# Patient Record
Sex: Female | Born: 1960 | Race: White | Hispanic: No | Marital: Married | Smoking: Never smoker
Health system: Southern US, Community
[De-identification: ages and names within clinical notes are randomized; demographics above are authoritative.]

## PROBLEM LIST (undated history)

## (undated) DIAGNOSIS — Z0000 Encounter for general adult medical examination without abnormal findings: Secondary | ICD-10-CM

## (undated) DIAGNOSIS — B019 Varicella without complication: Secondary | ICD-10-CM

## (undated) DIAGNOSIS — E039 Hypothyroidism, unspecified: Secondary | ICD-10-CM

## (undated) DIAGNOSIS — N950 Postmenopausal bleeding: Secondary | ICD-10-CM

## (undated) DIAGNOSIS — T7840XA Allergy, unspecified, initial encounter: Secondary | ICD-10-CM

## (undated) DIAGNOSIS — E669 Obesity, unspecified: Secondary | ICD-10-CM

## (undated) DIAGNOSIS — J309 Allergic rhinitis, unspecified: Secondary | ICD-10-CM

## (undated) DIAGNOSIS — R739 Hyperglycemia, unspecified: Secondary | ICD-10-CM

## (undated) DIAGNOSIS — Z124 Encounter for screening for malignant neoplasm of cervix: Secondary | ICD-10-CM

## (undated) DIAGNOSIS — I10 Essential (primary) hypertension: Secondary | ICD-10-CM

## (undated) DIAGNOSIS — N951 Menopausal and female climacteric states: Secondary | ICD-10-CM

## (undated) DIAGNOSIS — Z86010 Personal history of colonic polyps: Secondary | ICD-10-CM

## (undated) DIAGNOSIS — E782 Mixed hyperlipidemia: Secondary | ICD-10-CM

## (undated) DIAGNOSIS — M533 Sacrococcygeal disorders, not elsewhere classified: Secondary | ICD-10-CM

## (undated) DIAGNOSIS — K5790 Diverticulosis of intestine, part unspecified, without perforation or abscess without bleeding: Secondary | ICD-10-CM

## (undated) HISTORY — DX: Allergy, unspecified, initial encounter: T78.40XA

## (undated) HISTORY — DX: Mixed hyperlipidemia: E78.2

## (undated) HISTORY — DX: Varicella without complication: B01.9

## (undated) HISTORY — DX: Obesity, unspecified: E66.9

## (undated) HISTORY — PX: COLONOSCOPY: SHX174

## (undated) HISTORY — DX: Sacrococcygeal disorders, not elsewhere classified: M53.3

## (undated) HISTORY — DX: Hyperglycemia, unspecified: R73.9

## (undated) HISTORY — PX: POLYPECTOMY: SHX149

## (undated) HISTORY — DX: Encounter for screening for malignant neoplasm of cervix: Z12.4

## (undated) HISTORY — DX: Encounter for general adult medical examination without abnormal findings: Z00.00

## (undated) HISTORY — DX: Diverticulosis of intestine, part unspecified, without perforation or abscess without bleeding: K57.90

## (undated) HISTORY — PX: KNEE SURGERY: SHX244

## (undated) HISTORY — DX: Hypothyroidism, unspecified: E03.9

## (undated) HISTORY — DX: Postmenopausal bleeding: N95.0

## (undated) HISTORY — DX: Menopausal and female climacteric states: N95.1

## (undated) HISTORY — DX: Essential (primary) hypertension: I10

## (undated) HISTORY — DX: Allergic rhinitis, unspecified: J30.9

## (undated) HISTORY — DX: Personal history of colonic polyps: Z86.010

---

## 1993-07-17 HISTORY — PX: TUBAL LIGATION: SHX77

## 1998-02-12 ENCOUNTER — Ambulatory Visit (HOSPITAL_COMMUNITY): Admission: RE | Admit: 1998-02-12 | Discharge: 1998-02-12 | Payer: Self-pay | Admitting: Obstetrics and Gynecology

## 2000-07-20 ENCOUNTER — Encounter: Payer: Self-pay | Admitting: Obstetrics and Gynecology

## 2000-07-20 ENCOUNTER — Ambulatory Visit (HOSPITAL_COMMUNITY): Admission: RE | Admit: 2000-07-20 | Discharge: 2000-07-20 | Payer: Self-pay | Admitting: Obstetrics and Gynecology

## 2000-08-24 ENCOUNTER — Ambulatory Visit (HOSPITAL_COMMUNITY): Admission: RE | Admit: 2000-08-24 | Discharge: 2000-08-24 | Payer: Self-pay | Admitting: Obstetrics and Gynecology

## 2001-08-13 ENCOUNTER — Other Ambulatory Visit: Admission: RE | Admit: 2001-08-13 | Discharge: 2001-08-13 | Payer: Self-pay | Admitting: Obstetrics and Gynecology

## 2003-04-10 ENCOUNTER — Other Ambulatory Visit: Admission: RE | Admit: 2003-04-10 | Discharge: 2003-04-10 | Payer: Self-pay | Admitting: Obstetrics and Gynecology

## 2003-04-22 ENCOUNTER — Encounter: Payer: Self-pay | Admitting: Obstetrics and Gynecology

## 2003-04-22 ENCOUNTER — Encounter: Admission: RE | Admit: 2003-04-22 | Discharge: 2003-04-22 | Payer: Self-pay | Admitting: Obstetrics and Gynecology

## 2004-04-18 ENCOUNTER — Other Ambulatory Visit: Admission: RE | Admit: 2004-04-18 | Discharge: 2004-04-18 | Payer: Self-pay | Admitting: Obstetrics and Gynecology

## 2004-12-26 ENCOUNTER — Ambulatory Visit: Payer: Self-pay | Admitting: Internal Medicine

## 2005-01-02 ENCOUNTER — Ambulatory Visit: Payer: Self-pay | Admitting: Internal Medicine

## 2005-01-12 ENCOUNTER — Ambulatory Visit: Payer: Self-pay | Admitting: Internal Medicine

## 2006-01-29 ENCOUNTER — Ambulatory Visit: Payer: Self-pay | Admitting: Family Medicine

## 2006-03-06 ENCOUNTER — Ambulatory Visit: Payer: Self-pay | Admitting: Internal Medicine

## 2006-03-20 ENCOUNTER — Other Ambulatory Visit: Admission: RE | Admit: 2006-03-20 | Discharge: 2006-03-20 | Payer: Self-pay | Admitting: Internal Medicine

## 2006-03-20 ENCOUNTER — Ambulatory Visit: Payer: Self-pay | Admitting: Internal Medicine

## 2006-03-27 ENCOUNTER — Encounter: Admission: RE | Admit: 2006-03-27 | Discharge: 2006-03-27 | Payer: Self-pay | Admitting: Internal Medicine

## 2006-04-03 ENCOUNTER — Encounter: Admission: RE | Admit: 2006-04-03 | Discharge: 2006-04-03 | Payer: Self-pay | Admitting: Internal Medicine

## 2006-05-22 ENCOUNTER — Ambulatory Visit: Payer: Self-pay | Admitting: Internal Medicine

## 2006-06-27 ENCOUNTER — Ambulatory Visit: Payer: Self-pay | Admitting: Internal Medicine

## 2006-10-02 ENCOUNTER — Ambulatory Visit: Payer: Self-pay | Admitting: Internal Medicine

## 2006-10-02 LAB — CONVERTED CEMR LAB: TSH: 3.85 microintl units/mL (ref 0.35–5.50)

## 2007-03-26 ENCOUNTER — Ambulatory Visit: Payer: Self-pay | Admitting: Internal Medicine

## 2007-03-26 LAB — CONVERTED CEMR LAB
AST: 20 units/L (ref 0–37)
Albumin: 3.8 g/dL (ref 3.5–5.2)
Alkaline Phosphatase: 58 units/L (ref 39–117)
Basophils Absolute: 0 10*3/uL (ref 0.0–0.1)
Basophils Relative: 0.5 % (ref 0.0–1.0)
Bilirubin, Direct: 0.1 mg/dL (ref 0.0–0.3)
CO2: 30 meq/L (ref 19–32)
Creatinine, Ser: 0.8 mg/dL (ref 0.4–1.2)
Eosinophils Relative: 1.7 % (ref 0.0–5.0)
GFR calc Af Amer: 100 mL/min
Glucose, Bld: 85 mg/dL (ref 70–99)
HDL: 85.4 mg/dL (ref >39.0)
Ketones, urine, test strip: NEGATIVE
Lymphocytes Relative: 24.1 % (ref 12.0–46.0)
Monocytes Absolute: 0.4 10*3/uL (ref 0.2–0.7)
Monocytes Relative: 6.7 % (ref 3.0–11.0)
Potassium: 4.8 meq/L (ref 3.5–5.1)
Protein, U semiquant: NEGATIVE
RBC: 4.12 M/uL (ref 3.87–5.11)
RDW: 13.2 % (ref 11.5–14.6)
Specific Gravity, Urine: >=1.03
TSH: 5.49 microintl units/mL (ref 0.35–5.50)
Total Bilirubin: 0.7 mg/dL (ref 0.3–1.2)
Total CHOL/HDL Ratio: 2.4
Triglycerides: 148 mg/dL (ref 0–149)
Urobilinogen, UA: 0.2
WBC Urine, dipstick: NEGATIVE
WBC: 5.8 10*3/uL (ref 4.5–10.5)
pH: 5.5

## 2007-03-28 DIAGNOSIS — Z9109 Other allergy status, other than to drugs and biological substances: Secondary | ICD-10-CM

## 2007-03-28 DIAGNOSIS — E039 Hypothyroidism, unspecified: Secondary | ICD-10-CM

## 2007-03-28 DIAGNOSIS — J309 Allergic rhinitis, unspecified: Secondary | ICD-10-CM

## 2007-03-28 DIAGNOSIS — M545 Low back pain: Secondary | ICD-10-CM

## 2007-03-28 HISTORY — DX: Allergic rhinitis, unspecified: J30.9

## 2007-03-28 HISTORY — DX: Hypothyroidism, unspecified: E03.9

## 2007-04-02 ENCOUNTER — Encounter: Payer: Self-pay | Admitting: Internal Medicine

## 2007-04-02 ENCOUNTER — Ambulatory Visit: Payer: Self-pay | Admitting: Internal Medicine

## 2007-04-02 ENCOUNTER — Other Ambulatory Visit: Admission: RE | Admit: 2007-04-02 | Discharge: 2007-04-02 | Payer: Self-pay | Admitting: Internal Medicine

## 2007-04-09 ENCOUNTER — Encounter: Admission: RE | Admit: 2007-04-09 | Discharge: 2007-04-09 | Payer: Self-pay | Admitting: Internal Medicine

## 2007-04-10 ENCOUNTER — Telehealth: Payer: Self-pay | Admitting: *Deleted

## 2007-05-27 ENCOUNTER — Ambulatory Visit: Payer: Self-pay | Admitting: Internal Medicine

## 2007-06-03 ENCOUNTER — Ambulatory Visit: Payer: Self-pay | Admitting: Internal Medicine

## 2007-09-03 ENCOUNTER — Ambulatory Visit: Payer: Self-pay | Admitting: Internal Medicine

## 2007-09-03 LAB — CONVERTED CEMR LAB: TSH: 2.77 microintl units/mL (ref 0.35–5.50)

## 2007-09-19 ENCOUNTER — Ambulatory Visit: Payer: Self-pay | Admitting: Internal Medicine

## 2007-09-19 DIAGNOSIS — T22039A Burn of unspecified degree of unspecified upper arm, initial encounter: Secondary | ICD-10-CM | POA: Insufficient documentation

## 2007-11-22 ENCOUNTER — Ambulatory Visit: Payer: Self-pay | Admitting: Internal Medicine

## 2007-11-29 ENCOUNTER — Ambulatory Visit: Payer: Self-pay | Admitting: Internal Medicine

## 2008-03-30 ENCOUNTER — Ambulatory Visit: Payer: Self-pay | Admitting: Internal Medicine

## 2008-03-30 LAB — CONVERTED CEMR LAB
ALT: 18 units/L (ref 0–35)
Albumin: 4 g/dL (ref 3.5–5.2)
Alkaline Phosphatase: 53 units/L (ref 39–117)
BUN: 17 mg/dL (ref 6–23)
Basophils Absolute: 0 10*3/uL (ref 0.0–0.1)
Bilirubin Urine: NEGATIVE
CO2: 30 meq/L (ref 19–32)
Cholesterol: 161 mg/dL (ref 0–200)
Creatinine, Ser: 0.9 mg/dL (ref 0.4–1.2)
Eosinophils Relative: 2.1 % (ref 0.0–5.0)
GFR calc non Af Amer: 72 mL/min
Glucose, Urine, Semiquant: NEGATIVE
HDL: 58.9 mg/dL (ref >39.0)
Ketones, urine, test strip: NEGATIVE
LDL Cholesterol: 66 mg/dL (ref 0–99)
Lymphocytes Relative: 28 % (ref 12.0–46.0)
MCHC: 34.8 g/dL (ref 30.0–36.0)
MCV: 93.2 fL (ref 78.0–100.0)
Platelets: 191 10*3/uL (ref 150–400)
RBC: 4.12 M/uL (ref 3.87–5.11)
RDW: 12.4 % (ref 11.5–14.6)
Sodium: 141 meq/L (ref 135–145)
TSH: 3.07 microintl units/mL (ref 0.35–5.50)
Total Protein: 7.2 g/dL (ref 6.0–8.3)
Triglycerides: 182 mg/dL — ABNORMAL HIGH (ref 0–149)

## 2008-04-03 ENCOUNTER — Encounter: Payer: Self-pay | Admitting: Internal Medicine

## 2008-04-03 ENCOUNTER — Ambulatory Visit: Payer: Self-pay | Admitting: Internal Medicine

## 2008-04-03 ENCOUNTER — Other Ambulatory Visit: Admission: RE | Admit: 2008-04-03 | Discharge: 2008-04-03 | Payer: Self-pay | Admitting: Internal Medicine

## 2008-04-03 DIAGNOSIS — N951 Menopausal and female climacteric states: Secondary | ICD-10-CM

## 2008-04-03 HISTORY — DX: Menopausal and female climacteric states: N95.1

## 2008-04-22 ENCOUNTER — Encounter: Admission: RE | Admit: 2008-04-22 | Discharge: 2008-04-22 | Payer: Self-pay | Admitting: Internal Medicine

## 2008-09-28 ENCOUNTER — Ambulatory Visit: Payer: Self-pay | Admitting: Internal Medicine

## 2008-10-05 ENCOUNTER — Ambulatory Visit: Payer: Self-pay | Admitting: Internal Medicine

## 2008-10-05 DIAGNOSIS — I10 Essential (primary) hypertension: Secondary | ICD-10-CM

## 2008-10-05 HISTORY — DX: Essential (primary) hypertension: I10

## 2009-01-12 ENCOUNTER — Telehealth: Payer: Self-pay | Admitting: Internal Medicine

## 2009-03-29 ENCOUNTER — Ambulatory Visit: Payer: Self-pay | Admitting: Internal Medicine

## 2009-03-29 LAB — CONVERTED CEMR LAB
Bilirubin Urine: NEGATIVE
CO2: 30 meq/L (ref 19–32)
Calcium: 8.9 mg/dL (ref 8.4–10.5)
Chloride: 107 meq/L (ref 96–112)
Eosinophils Absolute: 0.1 10*3/uL (ref 0.0–0.7)
GFR calc non Af Amer: 81.86 mL/min (ref >60)
HCT: 40.7 % (ref 36.0–46.0)
Hemoglobin: 13.8 g/dL (ref 12.0–15.0)
Ketones, urine, test strip: NEGATIVE
Neutro Abs: 4.9 10*3/uL (ref 1.4–7.7)
Nitrite: NEGATIVE
Potassium: 4.7 meq/L (ref 3.5–5.1)
RDW: 13 % (ref 11.5–14.6)
Sodium: 142 meq/L (ref 135–145)
Specific Gravity, Urine: 1.025
TSH: 2.19 microintl units/mL (ref 0.35–5.50)
Total Bilirubin: 1 mg/dL (ref 0.3–1.2)
Total CHOL/HDL Ratio: 2
Urobilinogen, UA: 0.2
VLDL: 16 mg/dL (ref 0.0–40.0)
WBC Urine, dipstick: NEGATIVE
pH: 5

## 2009-04-05 ENCOUNTER — Other Ambulatory Visit: Admission: RE | Admit: 2009-04-05 | Discharge: 2009-04-05 | Payer: Self-pay | Admitting: Internal Medicine

## 2009-04-05 ENCOUNTER — Ambulatory Visit: Payer: Self-pay | Admitting: Internal Medicine

## 2009-04-05 ENCOUNTER — Encounter: Payer: Self-pay | Admitting: Internal Medicine

## 2009-04-23 ENCOUNTER — Encounter: Admission: RE | Admit: 2009-04-23 | Discharge: 2009-04-23 | Payer: Self-pay | Admitting: Internal Medicine

## 2009-05-03 ENCOUNTER — Telehealth: Payer: Self-pay | Admitting: Internal Medicine

## 2009-05-03 DIAGNOSIS — N950 Postmenopausal bleeding: Secondary | ICD-10-CM | POA: Insufficient documentation

## 2009-05-03 HISTORY — DX: Postmenopausal bleeding: N95.0

## 2009-05-05 ENCOUNTER — Encounter: Admission: RE | Admit: 2009-05-05 | Discharge: 2009-05-05 | Payer: Self-pay | Admitting: Internal Medicine

## 2009-10-04 ENCOUNTER — Ambulatory Visit: Payer: Self-pay | Admitting: Internal Medicine

## 2010-02-01 ENCOUNTER — Ambulatory Visit: Payer: Self-pay | Admitting: Internal Medicine

## 2010-04-04 ENCOUNTER — Telehealth: Payer: Self-pay | Admitting: Internal Medicine

## 2010-04-26 ENCOUNTER — Encounter: Admission: RE | Admit: 2010-04-26 | Discharge: 2010-04-26 | Payer: Self-pay | Admitting: Internal Medicine

## 2010-05-18 ENCOUNTER — Ambulatory Visit: Payer: Self-pay | Admitting: Internal Medicine

## 2010-05-18 DIAGNOSIS — E669 Obesity, unspecified: Secondary | ICD-10-CM

## 2010-05-18 HISTORY — DX: Obesity, unspecified: E66.9

## 2010-06-28 ENCOUNTER — Telehealth: Payer: Self-pay | Admitting: Internal Medicine

## 2010-07-29 ENCOUNTER — Encounter: Payer: Self-pay | Admitting: *Deleted

## 2010-08-07 ENCOUNTER — Encounter: Payer: Self-pay | Admitting: Internal Medicine

## 2010-08-16 NOTE — Progress Notes (Signed)
Summary: REQ FOR SAMPLES  Phone Note Call from Patient   Caller: Patient     [phone] Reason for Call: Acute Illness Summary of Call: Pt had to reschedule her appt that was on 9/23 and it was rescheduled to 11/2..... Pt adv that she will need more samples of BP med that Dr Lovell Sheehan had her on: ( Benicar?)  to last her till her appt....?  Initial call taken by: Debbra Riding,  April 04, 2010 3:12 PM  Follow-up for Phone Call        samples given Follow-up by: Willy Eddy, LPN,  April 04, 2010 5:12 PM
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
## 2010-08-17 ENCOUNTER — Ambulatory Visit (INDEPENDENT_AMBULATORY_CARE_PROVIDER_SITE_OTHER): Payer: Managed Care, Other (non HMO) | Admitting: Internal Medicine

## 2010-08-17 ENCOUNTER — Ambulatory Visit: Admit: 2010-08-17 | Payer: Self-pay | Admitting: Internal Medicine

## 2010-08-17 ENCOUNTER — Encounter: Payer: Self-pay | Admitting: Internal Medicine

## 2010-08-17 VITALS — BP 124/80 | HR 76 | Temp 98.1°F | Resp 14 | Ht 67.0 in | Wt 168.0 lb

## 2010-08-17 DIAGNOSIS — I10 Essential (primary) hypertension: Secondary | ICD-10-CM

## 2010-08-17 DIAGNOSIS — R635 Abnormal weight gain: Secondary | ICD-10-CM

## 2010-08-17 DIAGNOSIS — N951 Menopausal and female climacteric states: Secondary | ICD-10-CM

## 2010-08-17 DIAGNOSIS — E785 Hyperlipidemia, unspecified: Secondary | ICD-10-CM

## 2010-08-17 DIAGNOSIS — E039 Hypothyroidism, unspecified: Secondary | ICD-10-CM

## 2010-08-17 LAB — HEPATIC FUNCTION PANEL
AST: 21 U/L (ref 0–37)
Albumin: 4.5 g/dL (ref 3.5–5.2)
Total Bilirubin: 0.5 mg/dL (ref 0.3–1.2)

## 2010-08-17 LAB — BASIC METABOLIC PANEL
CO2: 31 mEq/L (ref 19–32)
Calcium: 10.1 mg/dL (ref 8.4–10.5)
Chloride: 101 mEq/L (ref 96–112)
Creatinine, Ser: 1 mg/dL (ref 0.4–1.2)
Glucose, Bld: 83 mg/dL (ref 70–99)
Sodium: 140 mEq/L (ref 135–145)

## 2010-08-17 LAB — T4, FREE: Free T4: 1.08 ng/dL (ref 0.60–1.60)

## 2010-08-17 LAB — T3, FREE: T3, Free: 2.8 pg/mL (ref 2.3–4.2)

## 2010-08-17 LAB — TSH: TSH: 1.17 u[IU]/mL (ref 0.35–5.50)

## 2010-08-17 NOTE — Progress Notes (Signed)
  Subjective:    Patient ID: Rolena Infante, female    DOB: 05-Sep-1960, 50 y.o.   MRN: [id number]  Hypertension This is a chronic problem. The current episode started more than 1 year ago. The problem has been gradually improving since onset. The problem is controlled. Pertinent negatives include no malaise/fatigue, neck pain, orthopnea, palpitations, peripheral edema or shortness of breath. There are no associated agents to hypertension. Risk factors for coronary artery disease include no known risk factors. Past treatments include ACE inhibitors. The current treatment provides significant improvement. There are no compliance problems.  There is no history of angina, kidney disease or CAD/MI.      Review of Systems  Constitutional: Negative.  Negative for malaise/fatigue.  HENT: Negative.  Negative for neck pain.   Eyes: Negative.   Respiratory: Negative.  Negative for shortness of breath.   Cardiovascular: Negative.  Negative for palpitations and orthopnea.  Genitourinary: Negative.   Musculoskeletal: Negative.   Neurological: Negative.        Objective:   Physical Exam  Constitutional: She is oriented to person, place, and time. She appears well-developed and well-nourished.  HENT:  Head: Normocephalic and atraumatic.  Eyes: Conjunctivae are normal. Pupils are equal, round, and reactive to light.  Neck: Normal range of motion. Neck supple.  Cardiovascular: Normal rate.   Pulmonary/Chest: Effort normal and breath sounds normal.  Abdominal: Soft. Bowel sounds are normal.  Neurological: She is alert and oriented to person, place, and time.  Skin: Skin is warm and dry.          Assessment & Plan:

## 2010-08-17 NOTE — Assessment & Plan Note (Signed)
Patient has lost weight and is attempting to follow a healthier diet and exercise we reinforced the need for daily exercise as part of weight loss at her young age.

## 2010-08-17 NOTE — Progress Notes (Signed)
Addended by: Rossie Muskrat on: 08/17/2010 04:52 PM   Modules accepted: Orders

## 2010-08-17 NOTE — Assessment & Plan Note (Signed)
Patient is stable on ACE inhibitor A  bmet  will be obtained today to monitor both potassium and renal function

## 2010-08-17 NOTE — Assessment & Plan Note (Signed)
Patient is on citalopram for her PMDD and has done well

## 2010-08-17 NOTE — Patient Instructions (Signed)
We are monitoring lab work today patient is instructed to continue with weight loss plans following a healthy diet and daily exercise

## 2010-08-17 NOTE — Assessment & Plan Note (Signed)
The patient has been on 100 mcg of Synthroid and appears to be stable however she is due to a TSH T3 free and T4 free

## 2010-08-18 NOTE — Progress Notes (Signed)
Summary: REFILL REQUEST  Phone Note Refill Request Message from:  Patient on June 28, 2010 8:40 AM  Refills Requested: Medication #1:  BENICAR HCT 20-12.5 MG TABS one by mouth daily.   Notes: [facility].  Medication #2:  SYNTHROID 100 MCG  TABS one by mouth daily   Notes: [facility].     Initial call taken by: Debbra Riding,  June 28, 2010 8:40 AM    Prescriptions: BENICAR HCT 20-12.5 MG TABS (OLMESARTAN MEDOXOMIL-HCTZ) one by mouth daily  #0 x 6   Entered by:   Willy Eddy, LPN   Authorized by:   Stacie Glaze MD   Signed by:   Willy Eddy, LPN on 60/45/4098   Method used:   Electronically to        [id number]       [address]       [place], [place]  [address]       Ph: [phone] or [phone]       Fax: [phone]   RxID:   [id number] SYNTHROID 100 MCG  TABS (LEVOTHYROXINE SODIUM) one by mouth daily  #30 Tablet x 6   Entered by:   Willy Eddy, LPN   Authorized by:   Stacie Glaze MD   Signed by:   Willy Eddy, LPN on 42/59/5638   Method used:   Electronically to        [id number] (retail)       [address]       [place], [place]  [address]       Ph: [phone] or [phone]       Fax: [phone]   RxID:   [id number]

## 2010-10-11 ENCOUNTER — Telehealth: Payer: Self-pay | Admitting: Internal Medicine

## 2010-10-11 MED ORDER — ALPRAZOLAM 0.25 MG PO TABS: 0.2500 mg | ORAL_TABLET | Freq: Three times a day (TID) | ORAL | Status: AC | PRN

## 2010-10-11 NOTE — Telephone Encounter (Signed)
Pt called and said that Dr Lovell Sheehan had prescribed a anti-anxiety med for pt 5-7 yrs ago. Pt is getting ready to fly to [place] and is needing this before her trip. Pls call in to [facility]

## 2010-10-11 NOTE — Telephone Encounter (Signed)
d 

## 2010-10-11 NOTE — Telephone Encounter (Signed)
Sent in xanax

## 2010-10-19 IMAGING — US US TRANSVAGINAL NON-OB
1 series · 14 of 25 positions shown · non-contrast
Comparison: None

CLINICAL DATA: Post menopausal bleeding.

TRANSABDOMINAL AND TRANSVAGINAL ULTRASOUND OF PELVIS
TECHNIQUE: Both transabdominal and transvaginal ultrasound
examinations of the pelvis were performed including evaluation of
the uterus, ovaries, adnexal regions, and pelvic cul-de-sac.

[Series 1: us transvaginal non-ob · 0.23mm/px · 14 of 34 slices shown]
[im 1/34]
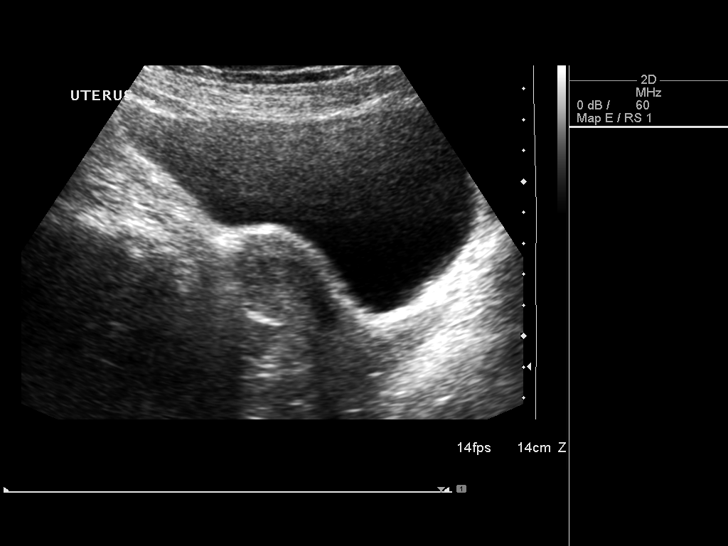
[im 3/34]
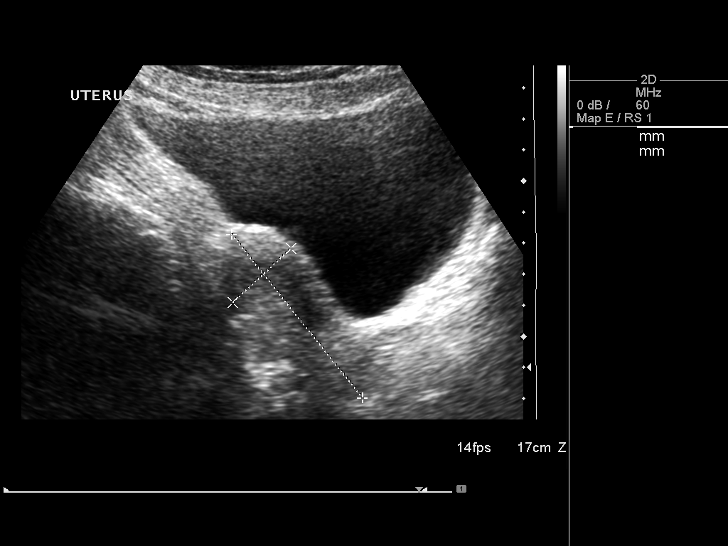
[im 6/34]
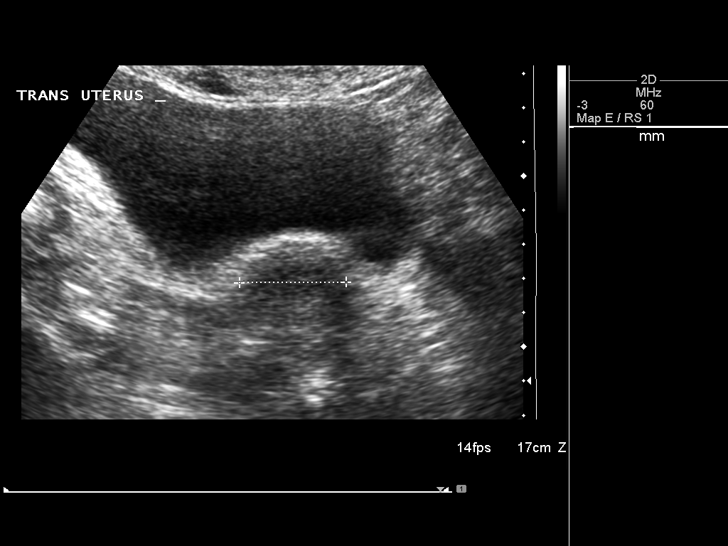
[im 9/34]
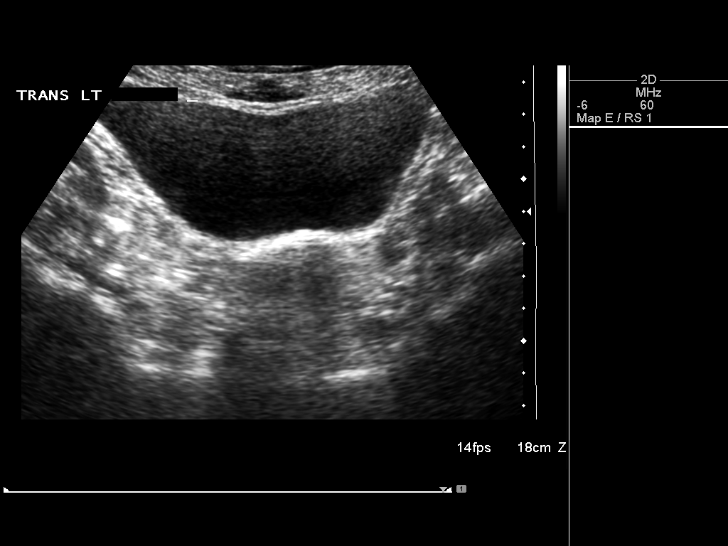
[im 12/34]
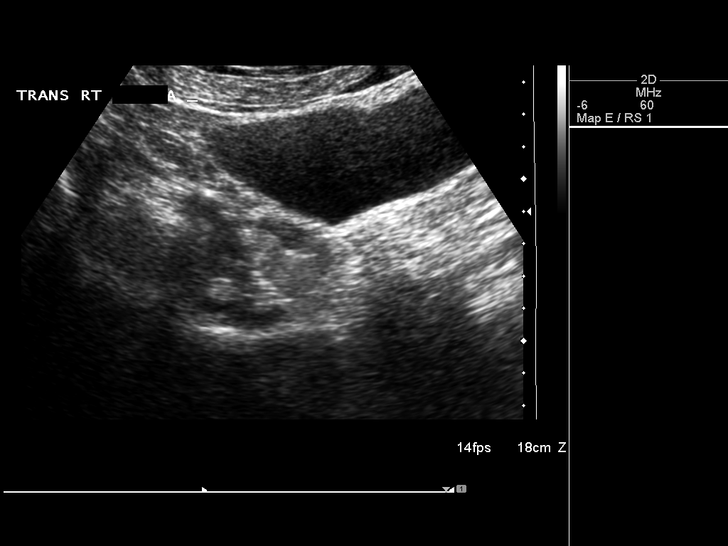
[im 13/34]
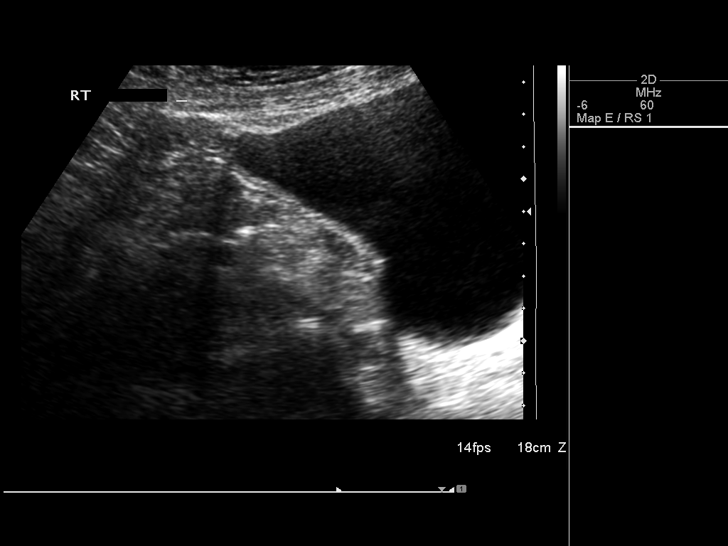
[im 16/34]
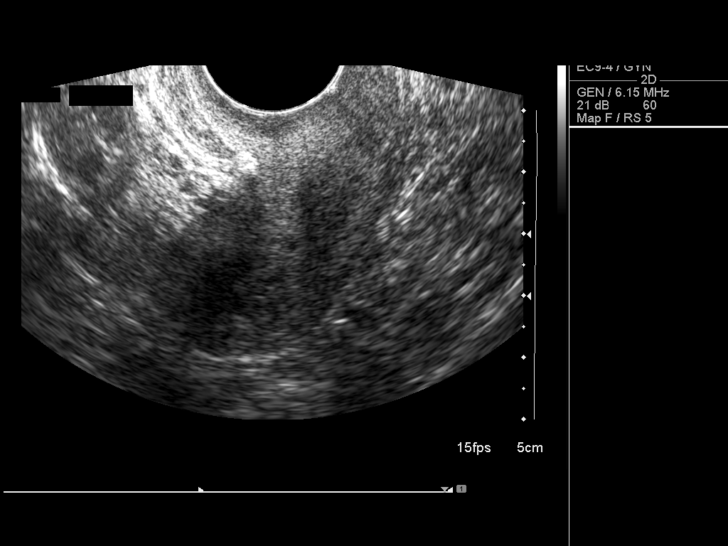
[im 18/34]
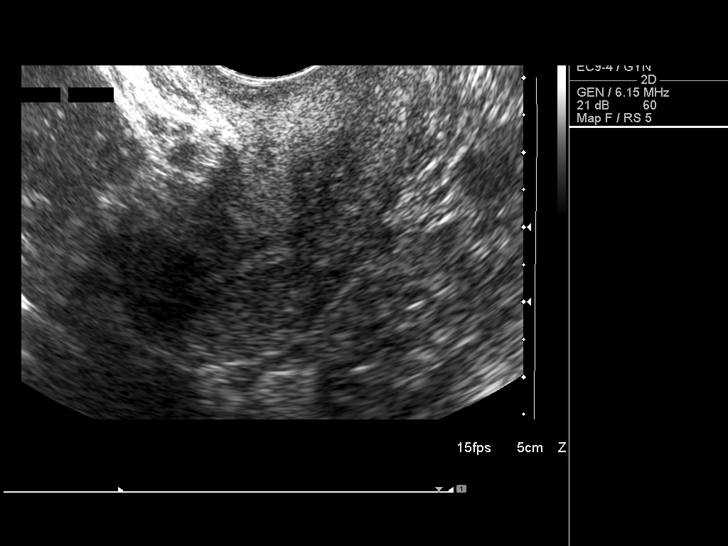
[im 21/34]
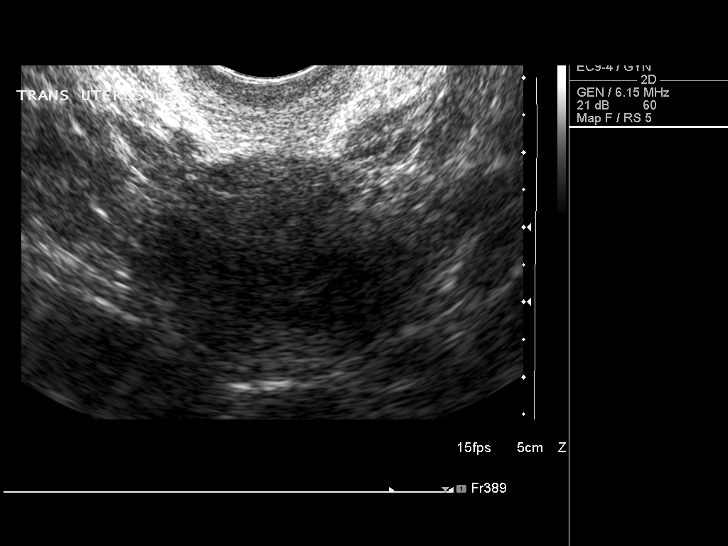
[im 23/34]
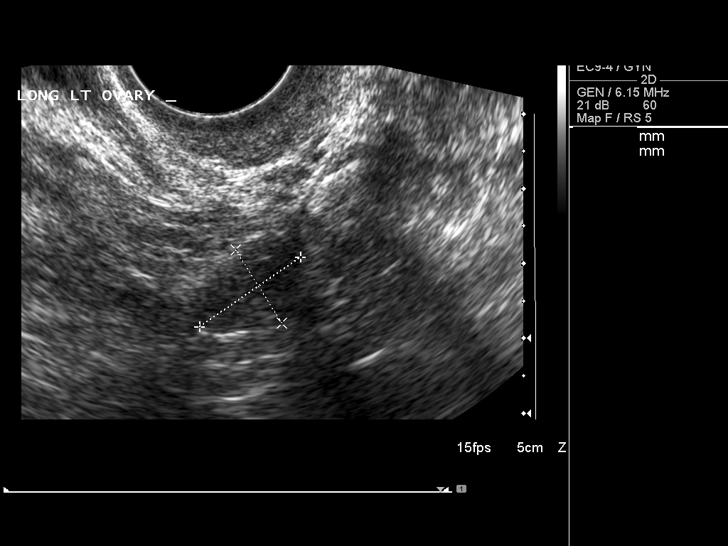
[im 25/34]
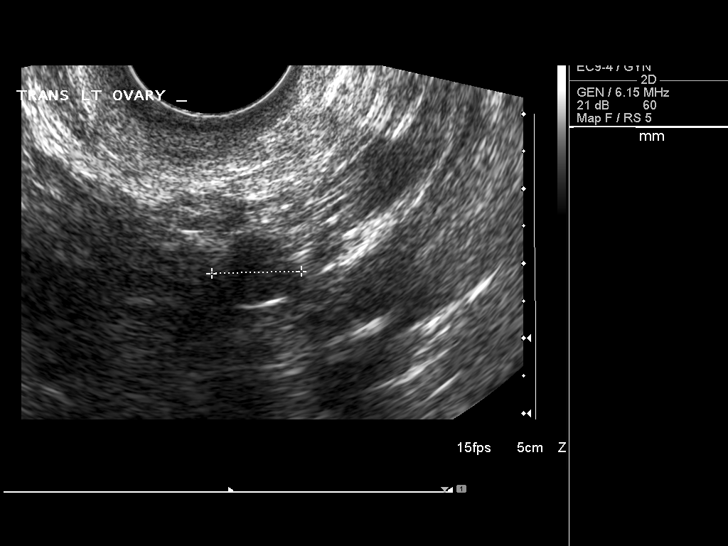
[im 28/34]
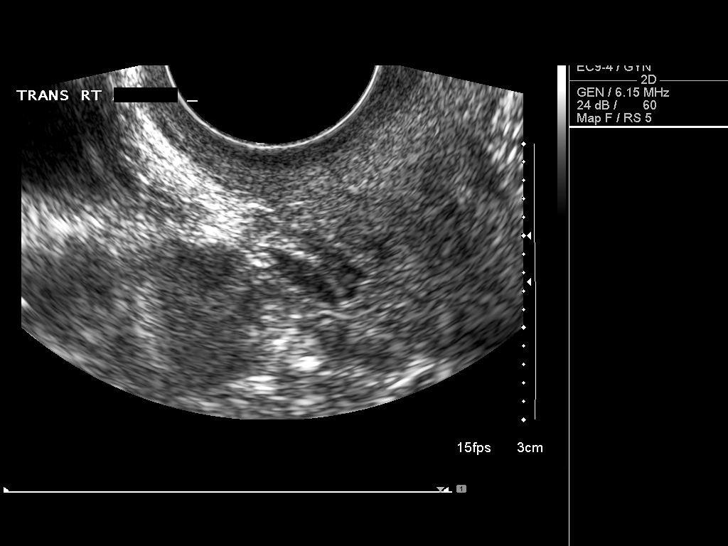
[im 31/34]
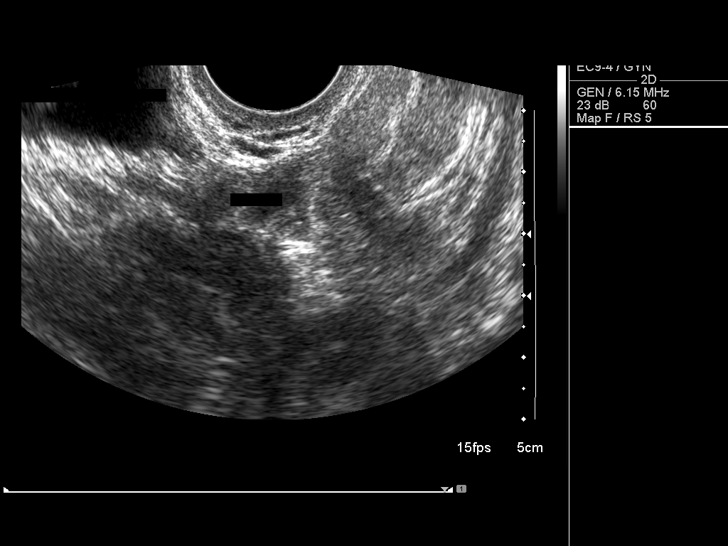
[im 34/34]
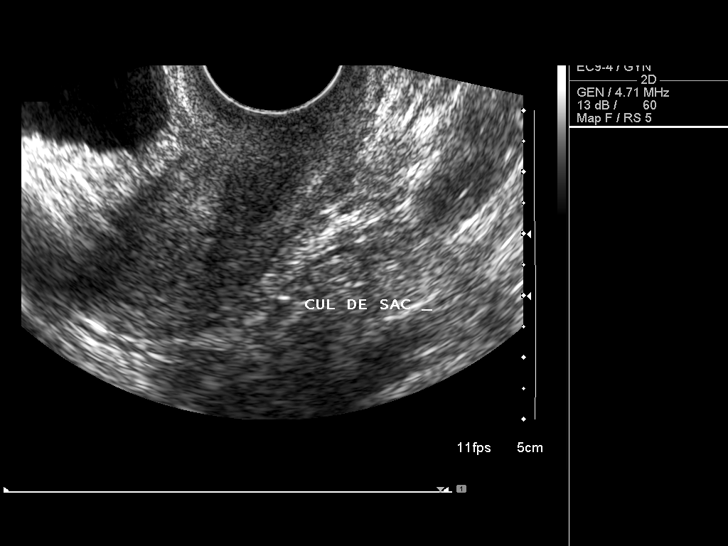

[14 of 25 positions shown; findings below may reference images not displayed]

FINDINGS: Uterus measures 6.7 cm in length and the fundus measures 2.6 x
cm transversely.  No uterine mass.

Endometrium measures 2.8 mm in thickness.  Negative for endometrial
mass or irregularity of the echotexture/echogenicity.

Right Ovary cannot be definitely identified.  No right adnexal
mass.

Left Ovary measures 1.6 x 1.2 x 1.2 cm.

Other Findings:  No free pelvic fluid.
IMPRESSION: Unremarkable pelvic ultrasound.  The right ovary cannot the
identified with certainty.  Normal uterus and left ovary.

## 2010-11-15 ENCOUNTER — Ambulatory Visit: Payer: Managed Care, Other (non HMO) | Admitting: Internal Medicine

## 2010-12-02 NOTE — Op Note (Signed)
[facility]  Patient:    Cindy Perkins, Cindy Perkins                        MRN: [id number] Proc. Date: 08/24/00 Adm. Date:  [id number] Attending:  Sharon Mt                           Operative Report  PREOPERATIVE DIAGNOSIS:  Desire for sterilization.  POSTOPERATIVE DIAGNOSIS:  Desire for sterilization.  OPERATION:  Laparoscopic sterilization.  SURGEON:  Daniel L. Eda Paschal, M.D.  ANESTHESIA:  General endotracheal anesthesia.  INDICATIONS:  The patient is a 50 year old, gravida 3, para 0, AB3, who desires permanent sterilization by laparoscopy.  She understands this is a permanent procedure, but she also appreciates that there have been pregnancies in spite of the surgery being done appropriately.  FINDINGS:  At the time of laparotomy, the patient had a seedling myoma at the top of the uterus of less than 1 cm.  Fallopian tubes and ovaries were normal, although she had very quiescent ovaries, and they almost looked postmenopausal in terms of how small they were.  Pelvic peritoneum was free of disease. Cul-de-sac was free of disease.  Right upper quadrant was visualized and was normal.  DESCRIPTION OF PROCEDURE:  After adequate general endotracheal anesthesia, the patient was placed in the dorsolithotomy position and prepped and draped in the usual sterile manner.   A Hulka catheter was inserted in the uterus.  The bladder was emptied with a Robinson catheter.  A pneumoperitoneum was created with a Vorhees needle; 3.5 liters of carbon dioxide were utilized.  It was placed subumbilically.  The subumbilical incision was then extended, and then a 10 mm trocar was placed.  Through that, the operating laparoscope was placed attached to the camera.  The peritoneum had been entered without difficulty. The right fallopian tube was identified to the fimbriated end.  It was elevated in the mid isthmic portion.  The Wolf bipolar unit, which went with the  system, was utilized.  The fallopian tube was cauterized until it was well blanched and until the needle setting went from 5 to 0.  Four centimeters of consecutive right fallopian tube were handled, leaving the cornual portion unblanched.  This tube was then cut, and it separated well.  Attention was turned to the left fallopian tube.  It was identified at fimbriated end.  it was elevated in the mid isthmic portion and coagulated until the needle setting went from 5 to 0 and the tube was well coagulated.  Four centimeters of left fallopian tube were handled in the similar fashion, once again leaving the cornual portion unblanched.  The tube was cut again and separated well. At the termination of the procedure, both tubes were separated, well blanched, without bleeding.  The trocar was removed.  The pneumoperitoneum was evacuated.  The fascia was closed with 0 Vicryl, and the skin was closed with 3-0 Monocryl.  Estimated blood loss for the entire procedure was less than 20 cc with none replaced.  The patient tolerated the procedure well and left the operating room in satisfactory condition. DD:  08/24/00 TD:  08/25/00 Job: [id number]

## 2010-12-15 ENCOUNTER — Encounter: Payer: Self-pay | Admitting: Internal Medicine

## 2010-12-15 ENCOUNTER — Ambulatory Visit (INDEPENDENT_AMBULATORY_CARE_PROVIDER_SITE_OTHER): Payer: Managed Care, Other (non HMO) | Admitting: Internal Medicine

## 2010-12-15 VITALS — BP 120/80 | HR 72 | Temp 98.2°F | Resp 16 | Ht 66.0 in | Wt 170.0 lb

## 2010-12-15 DIAGNOSIS — I10 Essential (primary) hypertension: Secondary | ICD-10-CM

## 2010-12-15 DIAGNOSIS — N951 Menopausal and female climacteric states: Secondary | ICD-10-CM

## 2010-12-15 DIAGNOSIS — Z0000 Encounter for general adult medical examination without abnormal findings: Secondary | ICD-10-CM

## 2010-12-15 DIAGNOSIS — E039 Hypothyroidism, unspecified: Secondary | ICD-10-CM

## 2010-12-15 MED ORDER — OLMESARTAN MEDOXOMIL 20 MG PO TABS: 20.0000 mg | ORAL_TABLET | Freq: Every day | ORAL | Status: DC

## 2010-12-15 NOTE — Patient Instructions (Signed)
For vaginal dryness try replense vaginal lubricant

## 2010-12-15 NOTE — Progress Notes (Signed)
Subjective:    Patient ID: Cindy Perkins, female    DOB: March 30, 1961, 50 y.o.   MRN: [id number]  HPI patient is a 50 year old white female presents for followup of hypertension she has been on Benicar 20 mg by mouth day and has had an excellent tolerance of the medication as well as a good result she reports her blood pressures at home have been all in the 120 to 1:30 systolic range.  She also has an acute complaint of vaginal dryness she is perimenopausal has noticed his comfort with intercourse.  We discussed the use of vaginal moisturizers and the possibility that she may need Premarin cream.  Otherwise she is doing well on her citalopram.    Review of Systems  Constitutional: Negative for activity change, appetite change and fatigue.  HENT: Negative for ear pain, congestion, neck pain, postnasal drip and sinus pressure.   Eyes: Negative for redness and visual disturbance.  Respiratory: Negative for cough, shortness of breath and wheezing.   Gastrointestinal: Negative for abdominal pain and abdominal distention.  Genitourinary: Negative for dysuria, frequency and menstrual problem.  Musculoskeletal: Negative for myalgias, joint swelling and arthralgias.  Skin: Negative for rash and wound.  Neurological: Negative for dizziness, weakness and headaches.  Hematological: Negative for adenopathy. Does not bruise/bleed easily.  Psychiatric/Behavioral: Negative for sleep disturbance and decreased concentration.   Past Medical History  Diagnosis Date  . HYPOTHYROIDISM 03/28/2007  . HYPERTENSION, SYSTOLIC, BORDERLINE 10/05/2008  . ALLERGIC RHINITIS 03/28/2007  . Postmenopausal bleeding 05/03/2009  . CLIMACTERIC STATE, FEMALE 04/03/2008   Past Surgical History  Procedure Date  . Rotator cuff repair   . Tubal ligation     reports that she has never smoked. She has never used smokeless tobacco. She reports that she drinks alcohol. She reports that she does not use illicit drugs. family  history includes Dementia in her father and mother; Diabetes in her father; and Obesity in her brother. Allergies  Allergen Reactions  . Penicillins        Objective:   Physical Exam  Constitutional: She is oriented to person, place, and time. She appears well-developed and well-nourished. No distress.  HENT:  Head: Normocephalic and atraumatic.  Right Ear: External ear normal.  Left Ear: External ear normal.  Nose: Nose normal.  Mouth/Throat: Oropharynx is clear and moist.  Eyes: Conjunctivae and EOM are normal. Pupils are equal, round, and reactive to light.  Neck: Normal range of motion. Neck supple. No JVD present. No tracheal deviation present. No thyromegaly present.  Cardiovascular: Normal rate, regular rhythm, normal heart sounds and intact distal pulses.   No murmur heard. Pulmonary/Chest: Effort normal and breath sounds normal. She has no wheezes. She exhibits no tenderness.  Abdominal: Soft. Bowel sounds are normal.  Musculoskeletal: Normal range of motion. She exhibits no edema and no tenderness.  Lymphadenopathy:    She has no cervical adenopathy.  Neurological: She is alert and oriented to person, place, and time. She has normal reflexes. No cranial nerve deficit.  Skin: Skin is warm and dry. She is not diaphoretic.  Psychiatric: She has a normal mood and affect. Her behavior is normal.          Assessment & Plan:  Postmenopausal bleeding with climacteric state.  Recommend Replens vaginal lubricant if this does not help would recommend low-dose Premarin 1 g per vagina Monday Wednesday Friday.  She is doing well on the Benicar refilled Benicar was sent to her pharmacy as well samples given today.  Her  depression is well controlled with the citalopram.  We reviewed labs drawn at the last visit which included a thyroid indicating that her Synthroid dose was stable and a renal function as well as a liver function

## 2011-04-05 ENCOUNTER — Other Ambulatory Visit: Payer: Self-pay | Admitting: Internal Medicine

## 2011-04-18 ENCOUNTER — Other Ambulatory Visit: Payer: Self-pay | Admitting: Internal Medicine

## 2011-04-18 DIAGNOSIS — Z1231 Encounter for screening mammogram for malignant neoplasm of breast: Secondary | ICD-10-CM

## 2011-05-03 ENCOUNTER — Ambulatory Visit
Admission: RE | Admit: 2011-05-03 | Discharge: 2011-05-03 | Disposition: A | Discharge status: Home / Self Care | Payer: Managed Care, Other (non HMO) | Source: Ambulatory Visit | Attending: Internal Medicine | Admitting: Internal Medicine

## 2011-05-03 DIAGNOSIS — Z1231 Encounter for screening mammogram for malignant neoplasm of breast: Secondary | ICD-10-CM

## 2011-06-23 ENCOUNTER — Other Ambulatory Visit (INDEPENDENT_AMBULATORY_CARE_PROVIDER_SITE_OTHER): Payer: Managed Care, Other (non HMO)

## 2011-06-23 DIAGNOSIS — Z0000 Encounter for general adult medical examination without abnormal findings: Secondary | ICD-10-CM

## 2011-06-23 LAB — POCT URINALYSIS DIPSTICK
Bilirubin, UA: NEGATIVE
Blood, UA: NEGATIVE
Glucose, UA: NEGATIVE
Ketones, UA: NEGATIVE
Leukocytes, UA: NEGATIVE
Nitrite, UA: NEGATIVE
Spec Grav, UA: 1.02
Urobilinogen, UA: 1
pH, UA: 6.5

## 2011-06-23 LAB — CBC WITH DIFFERENTIAL/PLATELET
Basophils Absolute: 0 10*3/uL (ref 0.0–0.1)
Eosinophils Absolute: 0.1 10*3/uL (ref 0.0–0.7)
Eosinophils Relative: 3.6 % (ref 0.0–5.0)
HCT: 41.3 % (ref 36.0–46.0)
Lymphs Abs: 1.5 10*3/uL (ref 0.7–4.0)
MCHC: 34.2 g/dL (ref 30.0–36.0)
MCV: 97.7 fl (ref 78.0–100.0)
Monocytes Absolute: 0.3 10*3/uL (ref 0.1–1.0)
Neutrophils Relative %: 46.3 % (ref 43.0–77.0)
Platelets: 141 10*3/uL — ABNORMAL LOW (ref 150.0–400.0)
RDW: 13.7 % (ref 11.5–14.6)
WBC: 3.7 10*3/uL — ABNORMAL LOW (ref 4.5–10.5)

## 2011-06-23 LAB — LIPID PANEL
HDL: 62.2 mg/dL (ref >39.00)
Triglycerides: 129 mg/dL (ref 0.0–149.0)
VLDL: 25.8 mg/dL (ref 0.0–40.0)

## 2011-06-23 LAB — BASIC METABOLIC PANEL
Calcium: 9.2 mg/dL (ref 8.4–10.5)
Creatinine, Ser: 1 mg/dL (ref 0.4–1.2)
GFR: 63.41 mL/min (ref >60.00)
Glucose, Bld: 91 mg/dL (ref 70–99)
Sodium: 146 mEq/L — ABNORMAL HIGH (ref 135–145)

## 2011-06-23 LAB — HEPATIC FUNCTION PANEL
Albumin: 4.1 g/dL (ref 3.5–5.2)
Alkaline Phosphatase: 46 U/L (ref 39–117)

## 2011-06-23 LAB — TSH: TSH: 2.75 u[IU]/mL (ref 0.35–5.50)

## 2011-06-24 LAB — VITAMIN D 25 HYDROXY (VIT D DEFICIENCY, FRACTURES): Vit D, 25-Hydroxy: 48 ng/mL (ref 30–89)

## 2011-06-30 ENCOUNTER — Encounter: Payer: Managed Care, Other (non HMO) | Admitting: Internal Medicine

## 2011-07-21 ENCOUNTER — Encounter: Payer: Self-pay | Admitting: Internal Medicine

## 2011-07-21 ENCOUNTER — Ambulatory Visit (INDEPENDENT_AMBULATORY_CARE_PROVIDER_SITE_OTHER): Payer: Managed Care, Other (non HMO) | Admitting: Internal Medicine

## 2011-07-21 ENCOUNTER — Other Ambulatory Visit (HOSPITAL_COMMUNITY)
Admission: RE | Admit: 2011-07-21 | Discharge: 2011-07-21 | Disposition: A | Discharge status: Home / Self Care | Payer: Managed Care, Other (non HMO) | Source: Ambulatory Visit | Attending: Internal Medicine | Admitting: Internal Medicine

## 2011-07-21 VITALS — BP 140/80 | HR 68 | Temp 98.2°F | Resp 16 | Ht 66.0 in | Wt 172.0 lb

## 2011-07-21 DIAGNOSIS — I10 Essential (primary) hypertension: Secondary | ICD-10-CM

## 2011-07-21 DIAGNOSIS — Z01419 Encounter for gynecological examination (general) (routine) without abnormal findings: Secondary | ICD-10-CM | POA: Insufficient documentation

## 2011-07-21 DIAGNOSIS — Z0000 Encounter for general adult medical examination without abnormal findings: Secondary | ICD-10-CM

## 2011-07-21 DIAGNOSIS — E039 Hypothyroidism, unspecified: Secondary | ICD-10-CM

## 2011-07-21 LAB — HM PAP SMEAR: HM Pap smear: NORMAL

## 2011-07-21 NOTE — Progress Notes (Signed)
Subjective:    Patient ID: Cindy Perkins, female    DOB: 1961/02/05, 51 y.o.   MRN: [id number]  HPI CPX    Review of Systems  Constitutional: Negative for activity change, appetite change and fatigue.  HENT: Negative for ear pain, congestion, neck pain, postnasal drip and sinus pressure.   Eyes: Negative for redness and visual disturbance.  Respiratory: Negative for cough, shortness of breath and wheezing.   Gastrointestinal: Negative for abdominal pain and abdominal distention.  Genitourinary: Negative for dysuria, frequency and menstrual problem.  Musculoskeletal: Negative for myalgias, joint swelling and arthralgias.  Skin: Negative for rash and wound.  Neurological: Negative for dizziness, weakness and headaches.  Hematological: Negative for adenopathy. Does not bruise/bleed easily.  Psychiatric/Behavioral: Negative for sleep disturbance and decreased concentration.   Past Medical History  Diagnosis Date  . HYPOTHYROIDISM 03/28/2007  . HYPERTENSION, SYSTOLIC, BORDERLINE 10/05/2008  . ALLERGIC RHINITIS 03/28/2007  . Postmenopausal bleeding 05/03/2009  . CLIMACTERIC STATE, FEMALE 04/03/2008    History   Social History  . Marital Status: Married    Spouse Name: N/A    Number of Children: N/A  . Years of Education: N/A   Occupational History  . Not on file.   Social History Main Topics  . Smoking status: Never Smoker   . Smokeless tobacco: Never Used  . Alcohol Use: Yes  . Drug Use: No  . Sexually Active: Yes    Birth Control/ Protection: Post-menopausal   Other Topics Concern  . Not on file   Social History Narrative  . No narrative on file    Past Surgical History  Procedure Date  . Rotator cuff repair   . Tubal ligation     Family History  Problem Relation Age of Onset  . Dementia Mother   . Dementia Father   . Diabetes Father   . Obesity Brother     Allergies  Allergen Reactions  . Penicillins     Current Outpatient Prescriptions on File  Prior to Visit  Medication Sig Dispense Refill  . ALPRAZolam (XANAX) 0.25 MG tablet Take 1 tablet (0.25 mg total) by mouth 3 (three) times daily as needed for anxiety.  30 tablet  0  . BENICAR HCT 20-12.5 MG per tablet TAKE 1 TABLET EVERY DAY  30 tablet  6  . Calcium Carbonate-Vitamin D (CALCIUM-VITAMIN D) 500-200 MG-UNIT per tablet Take 1 tablet by mouth 2 (two) times daily with meals.        . citalopram (CELEXA) 40 MG tablet Take 40 mg by mouth daily.        Marland Kitchen levothyroxine (SYNTHROID, LEVOTHROID) 100 MCG tablet TAKE 1 TABLET BY MOUTH EVERY DAY  30 tablet  5  . Multiple Vitamin (MULTIVITAMIN) capsule Take 1 capsule by mouth daily.        . pantoprazole (PROTONIX) 40 MG tablet Take 40 mg by mouth daily.          BP 140/80  Pulse 68  Temp 98.2 F (36.8 C)  Resp 16  Ht 5\' 6"  (1.676 m)  Wt 172 lb (78.019 kg)  BMI 27.76 kg/m2       Objective:   Physical Exam  Constitutional: She is oriented to person, place, and time. She appears well-developed and well-nourished. No distress.  HENT:  Head: Normocephalic and atraumatic.  Right Ear: External ear normal.  Left Ear: External ear normal.  Nose: Nose normal.  Mouth/Throat: Oropharynx is clear and moist.  Eyes: Conjunctivae and EOM are normal. Pupils are  equal, round, and reactive to light.  Neck: Normal range of motion. Neck supple. No JVD present. No tracheal deviation present. No thyromegaly present.  Cardiovascular: Normal rate, regular rhythm, normal heart sounds and intact distal pulses.   No murmur heard. Pulmonary/Chest: Effort normal and breath sounds normal. She has no wheezes. She exhibits no tenderness.  Abdominal: Soft. Bowel sounds are normal.  Genitourinary: No breast swelling, tenderness or discharge. There is no rash or tenderness on the right labia. There is no rash or tenderness on the left labia. No erythema or tenderness around the vagina.  Musculoskeletal: Normal range of motion. She exhibits no edema and no  tenderness.  Lymphadenopathy:    She has no cervical adenopathy.  Neurological: She is alert and oriented to person, place, and time. She has normal reflexes. No cranial nerve deficit.  Skin: Skin is warm and dry. She is not diaphoretic.  Psychiatric: She has a normal mood and affect. Her behavior is normal.          Assessment & Plan:   This is a routine physical examination for this healthy  Female. Reviewed all health maintenance protocols including mammography colonoscopy bone density and reviewed appropriate screening labs. Her immunization history was reviewed as well as her current medications and allergies refills of her chronic medications were given and the plan for yearly health maintenance was discussed all orders and referrals were made as appropriate.

## 2011-07-21 NOTE — Progress Notes (Signed)
Addended by: Willy Eddy on: 07/21/2011 05:20 PM   Modules accepted: Orders

## 2011-07-21 NOTE — Patient Instructions (Signed)
The patient is instructed to continue all medications as prescribed. Schedule followup with check out clerk upon leaving the clinic  

## 2011-08-04 ENCOUNTER — Other Ambulatory Visit: Payer: Self-pay | Admitting: *Deleted

## 2011-08-04 MED ORDER — LEVOTHYROXINE SODIUM 100 MCG PO TABS: 100.0000 ug | ORAL_TABLET | Freq: Every day | ORAL | Status: DC

## 2011-08-08 ENCOUNTER — Telehealth: Payer: Self-pay | Admitting: Family Medicine

## 2011-08-08 MED ORDER — LEVOTHYROXINE SODIUM 100 MCG PO TABS: 100.0000 ug | ORAL_TABLET | Freq: Every day | ORAL | Status: DC

## 2011-08-08 MED ORDER — OLMESARTAN MEDOXOMIL-HCTZ 20-12.5 MG PO TABS: 1.0000 | ORAL_TABLET | Freq: Every day | ORAL | Status: DC

## 2011-08-08 NOTE — Telephone Encounter (Signed)
Done

## 2011-08-08 NOTE — Telephone Encounter (Signed)
Pt needs 90-day supplies of her levothyroxine & Benicar HCT [facility] [address]

## 2011-12-12 ENCOUNTER — Telehealth: Payer: Self-pay | Admitting: Internal Medicine

## 2011-12-12 MED ORDER — ALPRAZOLAM 0.25 MG PO TABS: 0.2500 mg | ORAL_TABLET | Freq: Three times a day (TID) | ORAL | Status: AC | PRN

## 2011-12-12 NOTE — Telephone Encounter (Signed)
Per dr Lovell Sheehan- may have xanax.25 1 tid prn #20 with 0 refill

## 2011-12-12 NOTE — Telephone Encounter (Signed)
Patient called stating that she will be going on vacation in a week and she will be flying and will need the rx that the doctor previously gave her for anxiety. Please advise.

## 2012-01-15 ENCOUNTER — Ambulatory Visit (INDEPENDENT_AMBULATORY_CARE_PROVIDER_SITE_OTHER): Payer: Managed Care, Other (non HMO) | Admitting: Internal Medicine

## 2012-01-15 ENCOUNTER — Encounter: Payer: Self-pay | Admitting: Internal Medicine

## 2012-01-15 VITALS — BP 130/80 | HR 72 | Temp 98.2°F | Resp 16 | Ht 66.0 in | Wt 174.0 lb

## 2012-01-15 DIAGNOSIS — E039 Hypothyroidism, unspecified: Secondary | ICD-10-CM

## 2012-01-15 DIAGNOSIS — I10 Essential (primary) hypertension: Secondary | ICD-10-CM

## 2012-01-15 LAB — T4, FREE: Free T4: 1.01 ng/dL (ref 0.60–1.60)

## 2012-01-15 LAB — TSH: TSH: 1.09 u[IU]/mL (ref 0.35–5.50)

## 2012-01-15 LAB — BASIC METABOLIC PANEL
CO2: 29 mEq/L (ref 19–32)
Calcium: 9.6 mg/dL (ref 8.4–10.5)
Creatinine, Ser: 0.8 mg/dL (ref 0.4–1.2)
GFR: 78.62 mL/min (ref >60.00)
Sodium: 141 mEq/L (ref 135–145)

## 2012-01-15 LAB — T3, FREE: T3, Free: 2.7 pg/mL (ref 2.3–4.2)

## 2012-01-15 NOTE — Progress Notes (Signed)
  Subjective:    Patient ID: Cindy Perkins, female    DOB: 1961-01-14, 51 y.o.   MRN: [id number]  HPI Moderate obesity HTN follow up and renal check No chest pain, SOB or PND Monitoring for hypothyroidism on replacement     Review of Systems  Constitutional: Negative for activity change, appetite change and fatigue.  HENT: Negative for ear pain, congestion, neck pain, postnasal drip and sinus pressure.   Eyes: Negative for redness and visual disturbance.  Respiratory: Negative for cough, shortness of breath and wheezing.   Gastrointestinal: Negative for abdominal pain and abdominal distention.  Genitourinary: Negative for dysuria, frequency and menstrual problem.  Musculoskeletal: Negative for myalgias, joint swelling and arthralgias.  Skin: Negative for rash and wound.  Neurological: Negative for dizziness, weakness and headaches.  Hematological: Negative for adenopathy. Does not bruise/bleed easily.  Psychiatric/Behavioral: Negative for disturbed wake/sleep cycle and decreased concentration.       Objective:   Physical Exam  Nursing note and vitals reviewed. Constitutional: She is oriented to person, place, and time. She appears well-developed and well-nourished. No distress.  HENT:  Head: Normocephalic and atraumatic.  Right Ear: External ear normal.  Left Ear: External ear normal.  Nose: Nose normal.  Mouth/Throat: Oropharynx is clear and moist.  Eyes: Conjunctivae and EOM are normal. Pupils are equal, round, and reactive to light.  Neck: Normal range of motion. Neck supple. No JVD present. No tracheal deviation present. No thyromegaly present.  Cardiovascular: Normal rate, regular rhythm, normal heart sounds and intact distal pulses.   No murmur heard. Pulmonary/Chest: Effort normal and breath sounds normal. She has no wheezes. She exhibits no tenderness.  Abdominal: Soft. Bowel sounds are normal.  Musculoskeletal: Normal range of motion. She exhibits no edema and no  tenderness.  Lymphadenopathy:    She has no cervical adenopathy.  Neurological: She is alert and oriented to person, place, and time. She has normal reflexes. No cranial nerve deficit.  Skin: Skin is warm and dry. She is not diaphoretic.  Psychiatric: She has a normal mood and affect. Her behavior is normal.          Assessment & Plan:  monitor tsh, t3 f t4 f monitor bmet Stable on benicar for HTn reviewed principles of paleo diet for weight loss

## 2012-01-15 NOTE — Patient Instructions (Signed)
Practical paleo 

## 2012-04-30 ENCOUNTER — Other Ambulatory Visit: Payer: Self-pay | Admitting: Internal Medicine

## 2012-04-30 DIAGNOSIS — Z1231 Encounter for screening mammogram for malignant neoplasm of breast: Secondary | ICD-10-CM

## 2012-05-29 ENCOUNTER — Ambulatory Visit
Admission: RE | Admit: 2012-05-29 | Discharge: 2012-05-29 | Disposition: A | Discharge status: Home / Self Care | Payer: Managed Care, Other (non HMO) | Source: Ambulatory Visit | Attending: Internal Medicine | Admitting: Internal Medicine

## 2012-05-29 DIAGNOSIS — Z1231 Encounter for screening mammogram for malignant neoplasm of breast: Secondary | ICD-10-CM

## 2012-08-23 ENCOUNTER — Other Ambulatory Visit (INDEPENDENT_AMBULATORY_CARE_PROVIDER_SITE_OTHER): Payer: Managed Care, Other (non HMO)

## 2012-08-23 DIAGNOSIS — Z0000 Encounter for general adult medical examination without abnormal findings: Secondary | ICD-10-CM

## 2012-08-23 LAB — HEPATIC FUNCTION PANEL
ALT: 19 U/L (ref 0–35)
Albumin: 4.6 g/dL (ref 3.5–5.2)
Bilirubin, Direct: 0.1 mg/dL (ref 0.0–0.3)
Total Protein: 8 g/dL (ref 6.0–8.3)

## 2012-08-23 LAB — CBC WITH DIFFERENTIAL/PLATELET
Basophils Relative: 0.4 % (ref 0.0–3.0)
Eosinophils Relative: 1.9 % (ref 0.0–5.0)
HCT: 40.3 % (ref 36.0–46.0)
Hemoglobin: 13.9 g/dL (ref 12.0–15.0)
Lymphs Abs: 2 10*3/uL (ref 0.7–4.0)
MCV: 94.1 fl (ref 78.0–100.0)
Monocytes Absolute: 0.5 10*3/uL (ref 0.1–1.0)
Neutro Abs: 4 10*3/uL (ref 1.4–7.7)
Neutrophils Relative %: 61 % (ref 43.0–77.0)
RBC: 4.29 Mil/uL (ref 3.87–5.11)
WBC: 6.6 10*3/uL (ref 4.5–10.5)

## 2012-08-23 LAB — POCT URINALYSIS DIPSTICK
Ketones, UA: NEGATIVE
Leukocytes, UA: NEGATIVE
Protein, UA: NEGATIVE
pH, UA: 6

## 2012-08-23 LAB — BASIC METABOLIC PANEL
CO2: 30 mEq/L (ref 19–32)
Chloride: 103 mEq/L (ref 96–112)
Potassium: 4.5 mEq/L (ref 3.5–5.1)

## 2012-08-23 LAB — LIPID PANEL
Total CHOL/HDL Ratio: 2
VLDL: 21.2 mg/dL (ref 0.0–40.0)

## 2012-08-23 LAB — LDL CHOLESTEROL, DIRECT: Direct LDL: 87.7 mg/dL

## 2012-08-30 ENCOUNTER — Encounter: Payer: Managed Care, Other (non HMO) | Admitting: Internal Medicine

## 2012-09-02 ENCOUNTER — Ambulatory Visit (INDEPENDENT_AMBULATORY_CARE_PROVIDER_SITE_OTHER): Payer: Managed Care, Other (non HMO) | Admitting: Internal Medicine

## 2012-09-02 ENCOUNTER — Encounter: Payer: Self-pay | Admitting: Internal Medicine

## 2012-09-02 VITALS — BP 140/90 | HR 72 | Temp 98.6°F | Resp 16 | Ht 66.0 in | Wt 175.0 lb

## 2012-09-02 DIAGNOSIS — E039 Hypothyroidism, unspecified: Secondary | ICD-10-CM

## 2012-09-02 DIAGNOSIS — Z0000 Encounter for general adult medical examination without abnormal findings: Secondary | ICD-10-CM

## 2012-09-02 DIAGNOSIS — I10 Essential (primary) hypertension: Secondary | ICD-10-CM

## 2012-09-02 DIAGNOSIS — R635 Abnormal weight gain: Secondary | ICD-10-CM

## 2012-09-02 NOTE — Patient Instructions (Signed)
The patient is instructed to continue all medications as prescribed. Schedule followup with check out clerk upon leaving the clinic  

## 2012-09-02 NOTE — Progress Notes (Signed)
Subjective:    Patient ID: Cindy Perkins, female    DOB: [birth year], 52 y.o.   MRN: [id number]  HPI CPX Weight loss  Review of Systems  Constitutional: Negative for activity change, appetite change and fatigue.  HENT: Negative for ear pain, congestion, neck pain, postnasal drip and sinus pressure.   Eyes: Negative for redness and visual disturbance.  Respiratory: Negative for cough, shortness of breath and wheezing.   Gastrointestinal: Negative for abdominal pain and abdominal distention.  Genitourinary: Negative for dysuria, frequency and menstrual problem.  Musculoskeletal: Negative for myalgias, joint swelling and arthralgias.  Skin: Negative for rash and wound.  Neurological: Negative for dizziness, weakness and headaches.  Hematological: Negative for adenopathy. Does not bruise/bleed easily.  Psychiatric/Behavioral: Negative for sleep disturbance and decreased concentration.   Past Medical History  Diagnosis Date  . HYPOTHYROIDISM 03/28/2007  . HYPERTENSION, SYSTOLIC, BORDERLINE 10/05/2008  . ALLERGIC RHINITIS 03/28/2007  . Postmenopausal bleeding 05/03/2009  . CLIMACTERIC STATE, FEMALE 04/03/2008    History   Social History  . Marital Status: Married    Spouse Name: N/A    Number of Children: N/A  . Years of Education: N/A   Occupational History  . Not on file.   Social History Main Topics  . Smoking status: Never Smoker   . Smokeless tobacco: Never Used  . Alcohol Use: Yes  . Drug Use: No  . Sexually Active: Yes    Birth Control/ Protection: Post-menopausal   Other Topics Concern  . Not on file   Social History Narrative  . No narrative on file    Past Surgical History  Procedure Laterality Date  . Rotator cuff repair    . Tubal ligation      Family History  Problem Relation Age of Onset  . Dementia Mother   . Dementia Father   . Diabetes Father   . Obesity Brother     Allergies  Allergen Reactions  . Penicillins     Current Outpatient  Prescriptions on File Prior to Visit  Medication Sig Dispense Refill  . Calcium Carbonate-Vitamin D (CALCIUM-VITAMIN D) 500-200 MG-UNIT per tablet Take 1 tablet by mouth 2 (two) times daily with meals.        Marland Kitchen levothyroxine (SYNTHROID, LEVOTHROID) 100 MCG tablet Take 1 tablet (100 mcg total) by mouth daily.  90 tablet  3  . Multiple Vitamin (MULTIVITAMIN) capsule Take 1 capsule by mouth daily.        Marland Kitchen olmesartan-hydrochlorothiazide (BENICAR HCT) 20-12.5 MG per tablet Take 1 tablet by mouth daily.  90 tablet  3   No current facility-administered medications on file prior to visit.    BP 140/90  Pulse 72  Temp(Src) 98.6 F (37 C)  Resp 16  Ht 5\' 6"  (1.676 m)  Wt 175 lb (79.379 kg)  BMI 28.26 kg/m2       Objective:   Physical Exam  Nursing note and vitals reviewed. Constitutional: She is oriented to person, place, and time. She appears well-developed and well-nourished. No distress.  HENT:  Head: Normocephalic and atraumatic.  Right Ear: External ear normal.  Left Ear: External ear normal.  Nose: Nose normal.  Mouth/Throat: Oropharynx is clear and moist.  Eyes: Conjunctivae and EOM are normal. Pupils are equal, round, and reactive to light.  Neck: Normal range of motion. Neck supple. No JVD present. No tracheal deviation present. No thyromegaly present.  Cardiovascular: Normal rate, regular rhythm, normal heart sounds and intact distal pulses.   No murmur heard. Pulmonary/Chest:  Effort normal and breath sounds normal. She has no wheezes. She exhibits no tenderness.  Abdominal: Soft. Bowel sounds are normal.  Musculoskeletal: Normal range of motion. She exhibits no edema and no tenderness.  Lymphadenopathy:    She has no cervical adenopathy.  Neurological: She is alert and oriented to person, place, and time. She has normal reflexes. No cranial nerve deficit.  Skin: Skin is warm and dry. She is not diaphoretic.  Psychiatric: She has a normal mood and affect. Her behavior is  normal.          Assessment & Plan:   This is a routine physical examination for this healthy  Female. Reviewed all health maintenance protocols including mammography colonoscopy bone density and reviewed appropriate screening labs. Her immunization history was reviewed as well as her current medications and allergies refills of her chronic medications were given and the plan for yearly health maintenance was discussed all orders and referrals were made as appropriate.  reviewed labs and set goals

## 2012-10-21 ENCOUNTER — Other Ambulatory Visit: Payer: Self-pay | Admitting: Internal Medicine

## 2012-11-27 ENCOUNTER — Other Ambulatory Visit: Payer: Self-pay | Admitting: Internal Medicine

## 2013-01-12 ENCOUNTER — Other Ambulatory Visit: Payer: Self-pay | Admitting: Internal Medicine

## 2013-03-03 ENCOUNTER — Encounter: Payer: Self-pay | Admitting: Internal Medicine

## 2013-03-03 ENCOUNTER — Ambulatory Visit (INDEPENDENT_AMBULATORY_CARE_PROVIDER_SITE_OTHER): Payer: Managed Care, Other (non HMO) | Admitting: Internal Medicine

## 2013-03-03 VITALS — BP 132/84 | HR 72 | Temp 98.6°F | Resp 16 | Ht 66.0 in | Wt 172.0 lb

## 2013-03-03 DIAGNOSIS — N951 Menopausal and female climacteric states: Secondary | ICD-10-CM

## 2013-03-03 DIAGNOSIS — I10 Essential (primary) hypertension: Secondary | ICD-10-CM

## 2013-03-03 DIAGNOSIS — R635 Abnormal weight gain: Secondary | ICD-10-CM

## 2013-03-03 MED ORDER — LORCASERIN HCL 10 MG PO TABS: 1.0000 | ORAL_TABLET | Freq: Two times a day (BID) | ORAL | Status: DC

## 2013-03-03 NOTE — Progress Notes (Signed)
Subjective:    Patient ID: Cindy Perkins, female    DOB: June 01, 1961, 52 y.o.   MRN: [id number]  HPI Is a 52 year old female followed for hypertension and hypothyroidism.  She has physical scheduled for February.  Her blood pressure stable she denies any chest pain shortness of breath her primary complaint today is inability to lose weight she's tried multiple diet plans without success she is considering an appetite suppressant to help her to lose essentially weight for complicates her lipids   Review of Systems  Constitutional: Negative for activity change, appetite change and fatigue.  HENT: Negative for ear pain, congestion, neck pain, postnasal drip and sinus pressure.   Eyes: Negative for redness and visual disturbance.  Respiratory: Negative for cough, shortness of breath and wheezing.   Gastrointestinal: Negative for abdominal pain and abdominal distention.  Genitourinary: Negative for dysuria, frequency and menstrual problem.  Musculoskeletal: Negative for myalgias, joint swelling and arthralgias.  Skin: Negative for rash and wound.  Neurological: Negative for dizziness, weakness and headaches.  Hematological: Negative for adenopathy. Does not bruise/bleed easily.  Psychiatric/Behavioral: Negative for sleep disturbance and decreased concentration.   Past Medical History  Diagnosis Date  . HYPOTHYROIDISM 03/28/2007  . HYPERTENSION, SYSTOLIC, BORDERLINE 10/05/2008  . ALLERGIC RHINITIS 03/28/2007  . Postmenopausal bleeding 05/03/2009  . CLIMACTERIC STATE, FEMALE 04/03/2008    History   Social History  . Marital Status: Married    Spouse Name: N/A    Number of Children: N/A  . Years of Education: N/A   Occupational History  . Not on file.   Social History Main Topics  . Smoking status: Never Smoker   . Smokeless tobacco: Never Used  . Alcohol Use: Yes  . Drug Use: No  . Sexual Activity: Yes    Birth Control/ Protection: Post-menopausal   Other Topics Concern  .  Not on file   Social History Narrative  . No narrative on file    Past Surgical History  Procedure Laterality Date  . Rotator cuff repair    . Tubal ligation      Family History  Problem Relation Age of Onset  . Dementia Mother   . Dementia Father   . Diabetes Father   . Obesity Brother     Allergies  Allergen Reactions  . Penicillins     Current Outpatient Prescriptions on File Prior to Visit  Medication Sig Dispense Refill  . BENICAR HCT 20-12.5 MG per tablet TAKE 1 TABLET BY MOUTH EVERY DAY  90 tablet  3  . Calcium Carbonate-Vitamin D (CALCIUM-VITAMIN D) 500-200 MG-UNIT per tablet Take 1 tablet by mouth 2 (two) times daily with meals.        Marland Kitchen levothyroxine (SYNTHROID, LEVOTHROID) 100 MCG tablet TAKE 1 TABLET (100 MCG TOTAL) BY MOUTH DAILY.  90 tablet  3  . Multiple Vitamin (MULTIVITAMIN) capsule Take 1 capsule by mouth daily.         No current facility-administered medications on file prior to visit.    BP 132/84  Pulse 72  Temp(Src) 98.6 F (37 C)  Resp 16  Ht 5\' 6"  (1.676 m)  Wt 172 lb (78.019 kg)  BMI 27.77 kg/m2       Objective:   Physical Exam  Nursing note and vitals reviewed. Constitutional: She is oriented to person, place, and time. She appears well-developed and well-nourished.  HENT:  Head: Normocephalic and atraumatic.  Eyes: Conjunctivae are normal. Pupils are equal, round, and reactive to light.  Cardiovascular: Normal rate  and regular rhythm.   Pulmonary/Chest: Effort normal and breath sounds normal.  Abdominal: Bowel sounds are normal.  Neurological: She is alert and oriented to person, place, and time.  Skin: Skin is warm and dry.  Psychiatric: She has a normal mood and affect. Her behavior is normal.          Assessment & Plan:   Stable hypertension on current medications.  Stable hypothyroidism 100 mcg.  Weight control counseling.  I have spent more than 30 minutes examining this patient face-to-face of which over half  was spent in counseling We'll start his new medication Belviq 10 mg by mouth twice a day

## 2013-03-03 NOTE — Patient Instructions (Addendum)
The patient is instructed to continue all medications as prescribed. Schedule followup with check out clerk upon leaving the clinic  

## 2013-05-09 ENCOUNTER — Other Ambulatory Visit: Payer: Self-pay

## 2013-05-09 DIAGNOSIS — Z1231 Encounter for screening mammogram for malignant neoplasm of breast: Secondary | ICD-10-CM

## 2013-06-09 ENCOUNTER — Ambulatory Visit
Admission: RE | Admit: 2013-06-09 | Discharge: 2013-06-09 | Disposition: A | Discharge status: Home / Self Care | Payer: Managed Care, Other (non HMO) | Source: Ambulatory Visit

## 2013-06-09 DIAGNOSIS — Z1231 Encounter for screening mammogram for malignant neoplasm of breast: Secondary | ICD-10-CM

## 2013-08-28 ENCOUNTER — Other Ambulatory Visit (INDEPENDENT_AMBULATORY_CARE_PROVIDER_SITE_OTHER): Payer: Managed Care, Other (non HMO)

## 2013-08-28 DIAGNOSIS — Z0000 Encounter for general adult medical examination without abnormal findings: Secondary | ICD-10-CM

## 2013-08-28 LAB — POCT URINALYSIS DIPSTICK
Bilirubin, UA: NEGATIVE
GLUCOSE UA: NEGATIVE
Ketones, UA: NEGATIVE
LEUKOCYTES UA: NEGATIVE
NITRITE UA: NEGATIVE
Protein, UA: NEGATIVE
Spec Grav, UA: 1.015
UROBILINOGEN UA: 0.2
pH, UA: 6

## 2013-08-28 LAB — BASIC METABOLIC PANEL
BUN: 14 mg/dL (ref 6–23)
CHLORIDE: 105 meq/L (ref 96–112)
CO2: 30 meq/L (ref 19–32)
CREATININE: 0.9 mg/dL (ref 0.4–1.2)
Calcium: 9.7 mg/dL (ref 8.4–10.5)
GFR: 70.16 mL/min (ref >60.00)
GLUCOSE: 79 mg/dL (ref 70–99)
POTASSIUM: 5 meq/L (ref 3.5–5.1)
Sodium: 144 mEq/L (ref 135–145)

## 2013-08-28 LAB — LIPID PANEL
CHOL/HDL RATIO: 2
Cholesterol: 190 mg/dL (ref 0–200)
HDL: 90.4 mg/dL (ref >39.00)
LDL Cholesterol: 81 mg/dL (ref 0–99)
TRIGLYCERIDES: 91 mg/dL (ref 0.0–149.0)
VLDL: 18.2 mg/dL (ref 0.0–40.0)

## 2013-08-28 LAB — CBC WITH DIFFERENTIAL/PLATELET
Basophils Absolute: 0 10*3/uL (ref 0.0–0.1)
Basophils Relative: 0.6 % (ref 0.0–3.0)
EOS ABS: 0.1 10*3/uL (ref 0.0–0.7)
Eosinophils Relative: 1.8 % (ref 0.0–5.0)
HCT: 39.1 % (ref 36.0–46.0)
HEMOGLOBIN: 12.9 g/dL (ref 12.0–15.0)
Lymphocytes Relative: 23.6 % (ref 12.0–46.0)
Lymphs Abs: 1.3 10*3/uL (ref 0.7–4.0)
MCHC: 33 g/dL (ref 30.0–36.0)
MCV: 96.7 fl (ref 78.0–100.0)
MONO ABS: 0.4 10*3/uL (ref 0.1–1.0)
Monocytes Relative: 6.4 % (ref 3.0–12.0)
NEUTROS ABS: 3.8 10*3/uL (ref 1.4–7.7)
Neutrophils Relative %: 67.6 % (ref 43.0–77.0)
PLATELETS: 187 10*3/uL (ref 150.0–400.0)
RBC: 4.05 Mil/uL (ref 3.87–5.11)
RDW: 14 % (ref 11.5–14.6)
WBC: 5.6 10*3/uL (ref 4.5–10.5)

## 2013-08-28 LAB — HEPATIC FUNCTION PANEL
ALBUMIN: 4.2 g/dL (ref 3.5–5.2)
ALT: 16 U/L (ref 0–35)
AST: 19 U/L (ref 0–37)
Alkaline Phosphatase: 49 U/L (ref 39–117)
Bilirubin, Direct: 0 mg/dL (ref 0.0–0.3)
Total Bilirubin: 1 mg/dL (ref 0.3–1.2)
Total Protein: 7.4 g/dL (ref 6.0–8.3)

## 2013-08-28 LAB — TSH: TSH: 2.55 u[IU]/mL (ref 0.35–5.50)

## 2013-09-05 ENCOUNTER — Encounter: Payer: Self-pay | Admitting: Internal Medicine

## 2013-09-05 ENCOUNTER — Ambulatory Visit (INDEPENDENT_AMBULATORY_CARE_PROVIDER_SITE_OTHER): Payer: Managed Care, Other (non HMO) | Admitting: Internal Medicine

## 2013-09-05 VITALS — BP 130/80 | HR 80 | Temp 98.2°F | Resp 16 | Ht 66.0 in | Wt 182.0 lb

## 2013-09-05 DIAGNOSIS — Z0000 Encounter for general adult medical examination without abnormal findings: Secondary | ICD-10-CM

## 2013-09-05 NOTE — Progress Notes (Signed)
Pre visit review using our clinic review tool, if applicable. No additional management support is needed unless otherwise documented below in the visit note. 

## 2013-09-05 NOTE — Progress Notes (Signed)
Subjective:    Patient ID: Cindy Perkins, female    DOB: [birth year], 53 y.o.   MRN: [id number]  HPI Since her annual examination she has a history of hypertension hypothyroidism and moderate obesity.  She failed Belviq 2 stomach discomfort   Review of Systems  Constitutional: Negative for activity change, appetite change and fatigue.  HENT: Negative for congestion, ear pain, postnasal drip and sinus pressure.   Eyes: Negative for redness and visual disturbance.  Respiratory: Negative for cough, shortness of breath and wheezing.   Gastrointestinal: Negative for abdominal pain and abdominal distention.  Genitourinary: Negative for dysuria, frequency and menstrual problem.  Musculoskeletal: Negative for arthralgias, joint swelling, myalgias and neck pain.  Skin: Negative for rash and wound.  Neurological: Negative for dizziness, weakness and headaches.  Hematological: Negative for adenopathy. Does not bruise/bleed easily.  Psychiatric/Behavioral: Negative for sleep disturbance and decreased concentration.   Past Medical History  Diagnosis Date  . HYPOTHYROIDISM 03/28/2007  . HYPERTENSION, SYSTOLIC, BORDERLINE 0/93/2671  . ALLERGIC RHINITIS 03/28/2007  . Postmenopausal bleeding 05/03/2009  . CLIMACTERIC STATE, FEMALE 04/03/2008    History   Social History  . Marital Status: Married    Spouse Name: N/A    Number of Children: N/A  . Years of Education: N/A   Occupational History  . Not on file.   Social History Main Topics  . Smoking status: Never Smoker   . Smokeless tobacco: Never Used  . Alcohol Use: Yes  . Drug Use: No  . Sexual Activity: Yes    Birth Control/ Protection: Post-menopausal   Other Topics Concern  . Not on file   Social History Narrative  . No narrative on file    Past Surgical History  Procedure Laterality Date  . Rotator cuff repair    . Tubal ligation      Family History  Problem Relation Age of Onset  . Dementia Mother   . Dementia  Father   . Diabetes Father   . Obesity Brother     Allergies  Allergen Reactions  . Penicillins     Current Outpatient Prescriptions on File Prior to Visit  Medication Sig Dispense Refill  . BENICAR HCT 20-12.5 MG per tablet TAKE 1 TABLET BY MOUTH EVERY DAY  90 tablet  3  . Calcium Carbonate-Vitamin D (CALCIUM-VITAMIN D) 500-200 MG-UNIT per tablet Take 1 tablet by mouth 2 (two) times daily with meals.        Marland Kitchen levothyroxine (SYNTHROID, LEVOTHROID) 100 MCG tablet TAKE 1 TABLET (100 MCG TOTAL) BY MOUTH DAILY.  90 tablet  3  . Multiple Vitamin (MULTIVITAMIN) capsule Take 1 capsule by mouth daily.        . Lorcaserin HCl (BELVIQ) 10 MG TABS Take 1 tablet by mouth 2 (two) times daily.  60 tablet  5   No current facility-administered medications on file prior to visit.    BP 130/80  Pulse 80  Temp(Src) 98.2 F (36.8 C)  Resp 16  Ht 5\' 6"  (1.676 m)  Wt 182 lb (82.555 kg)  BMI 29.39 kg/m2        Objective:   Physical Exam  Constitutional: She is oriented to person, place, and time. She appears well-developed and well-nourished. No distress.  HENT:  Head: Normocephalic and atraumatic.  Right Ear: External ear normal.  Left Ear: External ear normal.  Nose: Nose normal.  Mouth/Throat: Oropharynx is clear and moist.  Eyes: Conjunctivae and EOM are normal. Pupils are equal, round, and reactive to  light.  Neck: Normal range of motion. Neck supple. No JVD present. No tracheal deviation present. No thyromegaly present.  Cardiovascular: Normal rate, regular rhythm, normal heart sounds and intact distal pulses.   No murmur heard. Pulmonary/Chest: Effort normal and breath sounds normal. She has no wheezes. She exhibits no tenderness.  Abdominal: Soft. Bowel sounds are normal.  Musculoskeletal: Normal range of motion. She exhibits no edema and no tenderness.  Lymphadenopathy:    She has no cervical adenopathy.  Neurological: She is alert and oriented to person, place, and time. She  has normal reflexes. No cranial nerve deficit.  Skin: Skin is warm and dry. She is not diaphoretic.  Psychiatric: She has a normal mood and affect. Her behavior is normal.          Assessment & Plan:   This is a routine physical examination for this healthy  Female. Reviewed all health maintenance protocols including mammography colonoscopy bone density and reviewed appropriate screening labs. Her immunization history was reviewed as well as her current medications and allergies refills of her chronic medications were given and the plan for yearly health maintenance was discussed all orders and referrals were made as appropriate.  Present medications and a reemphasis on a diet program although again high glycemic index foods paired with exercise

## 2013-09-05 NOTE — Patient Instructions (Signed)
The patient is instructed to continue all medications as prescribed. Schedule followup with check out clerk upon leaving the clinic  

## 2014-01-23 ENCOUNTER — Other Ambulatory Visit: Payer: Self-pay | Admitting: Internal Medicine

## 2014-03-04 ENCOUNTER — Ambulatory Visit: Payer: Managed Care, Other (non HMO) | Admitting: Internal Medicine

## 2014-03-10 ENCOUNTER — Other Ambulatory Visit: Payer: Self-pay | Admitting: Internal Medicine

## 2014-03-26 ENCOUNTER — Encounter: Payer: Self-pay | Admitting: Family Medicine

## 2014-03-26 ENCOUNTER — Ambulatory Visit (INDEPENDENT_AMBULATORY_CARE_PROVIDER_SITE_OTHER): Payer: Managed Care, Other (non HMO) | Admitting: Family Medicine

## 2014-03-26 VITALS — BP 135/84 | HR 67 | Temp 97.6°F | Ht 66.0 in | Wt 179.6 lb

## 2014-03-26 DIAGNOSIS — M7989 Other specified soft tissue disorders: Secondary | ICD-10-CM

## 2014-03-26 DIAGNOSIS — M545 Low back pain, unspecified: Secondary | ICD-10-CM

## 2014-03-26 DIAGNOSIS — R7309 Other abnormal glucose: Secondary | ICD-10-CM

## 2014-03-26 DIAGNOSIS — E039 Hypothyroidism, unspecified: Secondary | ICD-10-CM

## 2014-03-26 DIAGNOSIS — R739 Hyperglycemia, unspecified: Secondary | ICD-10-CM

## 2014-03-26 DIAGNOSIS — J309 Allergic rhinitis, unspecified: Secondary | ICD-10-CM

## 2014-03-26 DIAGNOSIS — M799 Soft tissue disorder, unspecified: Secondary | ICD-10-CM

## 2014-03-26 DIAGNOSIS — Z1211 Encounter for screening for malignant neoplasm of colon: Secondary | ICD-10-CM

## 2014-03-26 DIAGNOSIS — B019 Varicella without complication: Secondary | ICD-10-CM | POA: Insufficient documentation

## 2014-03-26 DIAGNOSIS — I10 Essential (primary) hypertension: Secondary | ICD-10-CM

## 2014-03-26 NOTE — Patient Instructions (Signed)

## 2014-03-26 NOTE — Assessment & Plan Note (Signed)
Well controlled, no changes to meds. Encouraged heart healthy diet such as the DASH diet and exercise as tolerated.  °

## 2014-03-26 NOTE — Progress Notes (Signed)
Pre visit review using our clinic review tool, if applicable. No additional management support is needed unless otherwise documented below in the visit note. 

## 2014-03-27 ENCOUNTER — Ambulatory Visit (HOSPITAL_BASED_OUTPATIENT_CLINIC_OR_DEPARTMENT_OTHER): Payer: Managed Care, Other (non HMO)

## 2014-03-27 ENCOUNTER — Ambulatory Visit: Payer: Managed Care, Other (non HMO)

## 2014-03-27 DIAGNOSIS — R7309 Other abnormal glucose: Secondary | ICD-10-CM

## 2014-03-27 LAB — LIPID PANEL
CHOL/HDL RATIO: 2
Cholesterol: 192 mg/dL (ref 0–200)
HDL: 77.5 mg/dL (ref >39.00)
LDL Cholesterol: 88 mg/dL (ref 0–99)
NonHDL: 114.5
Triglycerides: 131 mg/dL (ref 0.0–149.0)
VLDL: 26.2 mg/dL (ref 0.0–40.0)

## 2014-03-27 LAB — RENAL FUNCTION PANEL
ALBUMIN: 4 g/dL (ref 3.5–5.2)
BUN: 19 mg/dL (ref 6–23)
CHLORIDE: 102 meq/L (ref 96–112)
CO2: 31 mEq/L (ref 19–32)
Calcium: 9.1 mg/dL (ref 8.4–10.5)
Creatinine, Ser: 1 mg/dL (ref 0.4–1.2)
GFR: 64.97 mL/min (ref >60.00)
GLUCOSE: 152 mg/dL — AB (ref 70–99)
Phosphorus: 3.4 mg/dL (ref 2.3–4.6)
Potassium: 3.5 mEq/L (ref 3.5–5.1)
Sodium: 139 mEq/L (ref 135–145)

## 2014-03-27 LAB — CBC
HCT: 37.9 % (ref 36.0–46.0)
Hemoglobin: 12.8 g/dL (ref 12.0–15.0)
MCHC: 33.8 g/dL (ref 30.0–36.0)
MCV: 95.2 fl (ref 78.0–100.0)
PLATELETS: 196 10*3/uL (ref 150.0–400.0)
RBC: 3.98 Mil/uL (ref 3.87–5.11)
RDW: 13.8 % (ref 11.5–15.5)
WBC: 6.1 10*3/uL (ref 4.0–10.5)

## 2014-03-27 LAB — HEPATIC FUNCTION PANEL
ALT: 16 U/L (ref 0–35)
AST: 20 U/L (ref 0–37)
Albumin: 4 g/dL (ref 3.5–5.2)
Alkaline Phosphatase: 54 U/L (ref 39–117)
BILIRUBIN DIRECT: 0 mg/dL (ref 0.0–0.3)
BILIRUBIN TOTAL: 0.5 mg/dL (ref 0.2–1.2)
Total Protein: 7.4 g/dL (ref 6.0–8.3)

## 2014-03-27 LAB — HEMOGLOBIN A1C: Hgb A1c MFr Bld: 5 % (ref 4.6–6.5)

## 2014-03-27 LAB — TSH: TSH: 1.56 u[IU]/mL (ref 0.35–4.50)

## 2014-03-29 ENCOUNTER — Encounter: Payer: Self-pay | Admitting: Family Medicine

## 2014-03-29 DIAGNOSIS — M799 Soft tissue disorder, unspecified: Secondary | ICD-10-CM | POA: Insufficient documentation

## 2014-03-29 DIAGNOSIS — R739 Hyperglycemia, unspecified: Secondary | ICD-10-CM

## 2014-03-29 DIAGNOSIS — M7989 Other specified soft tissue disorders: Secondary | ICD-10-CM | POA: Insufficient documentation

## 2014-03-29 HISTORY — DX: Hyperglycemia, unspecified: R73.9

## 2014-03-29 NOTE — Progress Notes (Signed)
Patient ID: Cindy Perkins, female   DOB: 1961-07-12, 53 y.o.   MRN: [id number] Cindy Perkins [id number] 12/16/1960 03/29/2014      Progress Note-Follow Up  Subjective  Chief Complaint  Chief Complaint  Patient presents with  . Establish Care    new patient/ transfer from Dr [name]    HPI  Patient is a 53 year old female in today for routine medical care. Is here today to establish care. Is generally in good health but does struggle with chronic low back pain. 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 No fevers or recent illness. Denies CP/palp/SOB/HA/congestion/fevers/GI or GU c/o. Taking meds as prescribed  Past Medical History  Diagnosis Date  . HYPOTHYROIDISM 03/28/2007  . HYPERTENSION, SYSTOLIC, BORDERLINE 2/87/6811  . ALLERGIC RHINITIS 03/28/2007  . Postmenopausal bleeding 05/03/2009  . [place], FEMALE 04/03/2008  . Chicken pox as a child  . Hyperglycemia 03/29/2014    Past Surgical History  Procedure Laterality Date  . Tubal ligation  1995  . Knee surgery Left     surgical repair of injury    Family History  Problem Relation Age of Onset  . Dementia Mother   . Hypertension Mother   . Hyperlipidemia Mother   . COPD Mother     smoker but quit for 30 yrs  . Dementia Father   . Diabetes Father     type 2  . Hypertension Father   . Obesity Brother   . Cancer Brother 58    bone  . Heart attack Maternal Grandfather   . Diabetes Paternal Grandfather     type 2  . Obesity Brother   . Pulmonary embolism Brother     History   Social History  . Marital Status: Married    Spouse Name: N/A    Number of Children: N/A  . Years of Education: N/A   Occupational History  . Not on file.   Social History Main Topics  . Smoking status: Never Smoker   . Smokeless tobacco: Never Used  . Alcohol Use: Yes     Comment: 12 beers weekly  . Drug Use: No  . Sexual Activity: Yes     Birth Control/ Protection: Post-menopausal     Comment: lives with husband, works for [employer], no dietary restrictions   Other Topics Concern  . Not on file   Social History Narrative  . No narrative on file    Current Outpatient Prescriptions on File Prior to Visit  Medication Sig Dispense Refill  . BENICAR HCT 20-12.5 MG per tablet TAKE 1 TABLET BY MOUTH EVERY DAY  90 tablet  1  . Calcium Carbonate-Vitamin D (CALCIUM-VITAMIN D) 500-200 MG-UNIT per tablet Take 1 tablet by mouth 2 (two) times daily with meals.        Marland Kitchen levothyroxine (SYNTHROID, LEVOTHROID) 100 MCG tablet TAKE 1 TABLET (100 MCG TOTAL) BY MOUTH DAILY.  90 tablet  0  . Multiple Vitamin (MULTIVITAMIN) capsule Take 1 capsule by mouth daily.         No current facility-administered medications on file prior to visit.    Allergies  Allergen Reactions  . Penicillins Swelling    Review of Systems  Review of Systems  Constitutional: Negative for fever and malaise/fatigue.  HENT: Negative for congestion.   Eyes: Negative for discharge.  Respiratory: Negative for shortness of breath.   Cardiovascular: Negative for chest pain,  palpitations and leg swelling.  Gastrointestinal: Negative for nausea, abdominal pain and diarrhea.  Genitourinary: Negative for dysuria.  Musculoskeletal: Negative for falls.  Skin: Negative for rash.  Neurological: Negative for loss of consciousness and headaches.  Endo/Heme/Allergies: Negative for polydipsia.  Psychiatric/Behavioral: Negative for depression and suicidal ideas. The patient is not nervous/anxious and does not have insomnia.     Objective  BP 135/84  Pulse 67  Temp(Src) 97.6 F (36.4 C) (Oral)  Ht 5\' 6"  (1.676 m)  Wt 179 lb 9.6 oz (81.466 kg)  BMI 29.00 kg/m2  SpO2 100%  Physical Exam  Physical Exam  Constitutional: She is oriented to person, place, and time and well-developed, well-nourished, and in no distress. No distress.  HENT:  Head: Normocephalic and  atraumatic.  Eyes: Conjunctivae are normal.  Neck: Neck supple. No thyromegaly present.  Cardiovascular: Normal rate, regular rhythm and normal heart sounds.   No murmur heard. Pulmonary/Chest: Effort normal and breath sounds normal. She has no wheezes.  Abdominal: She exhibits no distension and no mass.  Musculoskeletal: She exhibits no edema.  Lymphadenopathy:    She has no cervical adenopathy.  Neurological: She is alert and oriented to person, place, and time.  Skin: Skin is warm and dry. No rash noted. She is not diaphoretic.  Psychiatric: Memory, affect and judgment normal.    Lab Results  Component Value Date   TSH 1.56 03/26/2014   Lab Results  Component Value Date   WBC 6.1 03/26/2014   HGB 12.8 03/26/2014   HCT 37.9 03/26/2014   MCV 95.2 03/26/2014   PLT 196.0 03/26/2014   Lab Results  Component Value Date   CREATININE 1.0 03/26/2014   BUN 19 03/26/2014   NA 139 03/26/2014   K 3.5 03/26/2014   CL 102 03/26/2014   CO2 31 03/26/2014   Lab Results  Component Value Date   ALT 16 03/26/2014   AST 20 03/26/2014   ALKPHOS 54 03/26/2014   BILITOT 0.5 03/26/2014   Lab Results  Component Value Date   CHOL 192 03/26/2014   Lab Results  Component Value Date   HDL 77.50 03/26/2014   Lab Results  Component Value Date   LDLCALC 88 03/26/2014   Lab Results  Component Value Date   TRIG 131.0 03/26/2014   Lab Results  Component Value Date   CHOLHDL 2 03/26/2014     Assessment & Plan  HYPERTENSION, SYSTOLIC, BORDERLINE Well controlled, no changes to meds. Encouraged heart healthy diet such as the DASH diet and exercise as tolerated.   HYPOTHYROIDISM On Levothyroxine, continue to monitor  LOW BACK PAIN 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 Is given rx today. Encouraged moist heat and gentle stretching as tolerated. May try NSAIDs and prescription meds as directed and report if symptoms worsen or seek immediate  care  Lesion of soft tissue of lower leg and ankle Benign appearing she will monitor and if it changes, grows or becomes uncomfortable will need to be evaluated by surgery will proceed ed with ultrasound today  ALLERGIC RHINITIS Doing well without routine meds  Hyperglycemia  minimize simple carbs. Increase exercise as tolerated.

## 2014-03-29 NOTE — Assessment & Plan Note (Signed)
Benign appearing she will monitor and if it changes, grows or becomes uncomfortable will need to be evaluated by surgery will proceed ed with ultrasound today

## 2014-03-29 NOTE — Assessment & Plan Note (Signed)
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 Is given rx today. Encouraged moist heat and gentle stretching as tolerated. May try NSAIDs and prescription meds as directed and report if symptoms worsen or seek immediate care

## 2014-03-29 NOTE — Assessment & Plan Note (Signed)
Doing well without routine meds

## 2014-03-29 NOTE — Assessment & Plan Note (Signed)
minimize simple carbs. Increase exercise as tolerated.  

## 2014-03-29 NOTE — Assessment & Plan Note (Signed)
On Levothyroxine, continue to monitor 

## 2014-04-06 ENCOUNTER — Encounter: Payer: Self-pay | Admitting: Internal Medicine

## 2014-05-05 ENCOUNTER — Other Ambulatory Visit: Payer: Self-pay

## 2014-05-05 DIAGNOSIS — Z1231 Encounter for screening mammogram for malignant neoplasm of breast: Secondary | ICD-10-CM

## 2014-05-25 ENCOUNTER — Ambulatory Visit (AMBULATORY_SURGERY_CENTER): Payer: Self-pay | Admitting: *Deleted

## 2014-05-25 VITALS — Ht 65.0 in | Wt 182.6 lb

## 2014-05-25 DIAGNOSIS — Z1211 Encounter for screening for malignant neoplasm of colon: Secondary | ICD-10-CM

## 2014-05-25 MED ORDER — MOVIPREP 100 G PO SOLR: 1.0000 | Freq: Once | ORAL | Status: DC

## 2014-05-25 NOTE — Progress Notes (Signed)
No egg or soy allergy. ewm No issues with past sedation.Marland Kitchen ewm No diet pills, no blood thinners. ewm No bowel problems. ewm No home 02 use. ewm Pt declined emmi video. ewm

## 2014-06-05 ENCOUNTER — Encounter: Payer: Self-pay | Admitting: Internal Medicine

## 2014-06-05 ENCOUNTER — Ambulatory Visit (AMBULATORY_SURGERY_CENTER): Payer: Managed Care, Other (non HMO) | Admitting: Internal Medicine

## 2014-06-05 VITALS — BP 119/76 | HR 60 | Temp 98.0°F | Resp 12 | Ht 65.0 in | Wt 182.0 lb

## 2014-06-05 DIAGNOSIS — D125 Benign neoplasm of sigmoid colon: Secondary | ICD-10-CM

## 2014-06-05 DIAGNOSIS — Z1211 Encounter for screening for malignant neoplasm of colon: Secondary | ICD-10-CM

## 2014-06-05 MED ORDER — SODIUM CHLORIDE 0.9 % IV SOLN: 500.0000 mL | INTRAVENOUS | Status: DC

## 2014-06-05 NOTE — Patient Instructions (Signed)
YOU HAD AN ENDOSCOPIC PROCEDURE TODAY AT THE [facility]: Refer to the procedure report that was given to you for any specific questions about what was found during the examination.  If the procedure report does not answer your questions, please call your gastroenterologist to clarify.  If you requested that your care partner not be given the details of your procedure findings, then the procedure report has been included in a sealed envelope for you to review at your convenience later.  YOU SHOULD EXPECT: Some feelings of bloating in the abdomen. Passage of more gas than usual.  Walking can help get rid of the air that was put into your GI tract during the procedure and reduce the bloating. If you had a lower endoscopy (such as a colonoscopy or flexible sigmoidoscopy) you may notice spotting of blood in your stool or on the toilet paper. If you underwent a bowel prep for your procedure, then you may not have a normal bowel movement for a few days.  DIET: Your first meal following the procedure should be a light meal and then it is ok to progress to your normal diet.  A half-sandwich or bowl of soup is an example of a good first meal.  Heavy or fried foods are harder to digest and may make you feel nauseous or bloated.  Likewise meals heavy in dairy and vegetables can cause extra gas to form and this can also increase the bloating.  Drink plenty of fluids but you should avoid alcoholic beverages for 24 hours.  ACTIVITY: Your care partner should take you home directly after the procedure.  You should plan to take it easy, moving slowly for the rest of the day.  You can resume normal activity the day after the procedure however you should NOT DRIVE or use heavy machinery for 24 hours (because of the sedation medicines used during the test).    SYMPTOMS TO REPORT IMMEDIATELY: A gastroenterologist can be reached at any hour.  During normal business hours, 8:30 AM to 5:00 PM Monday through Friday,  call [phone].  After hours and on weekends, please call the GI answering service at [phone] who will take a message and have the physician on call contact you.   Following lower endoscopy (colonoscopy or flexible sigmoidoscopy):  Excessive amounts of blood in the stool  Significant tenderness or worsening of abdominal pains  Swelling of the abdomen that is new, acute  Fever of 100F or higher  Following upper endoscopy (EGD)  Vomiting of blood or coffee ground material  New chest pain or pain under the shoulder blades  Painful or persistently difficult swallowing  New shortness of breath  Fever of 100F or higher  Black, tarry-looking stools  FOLLOW UP: If any biopsies were taken you will be contacted by phone or by letter within the next 1-3 weeks.  Call your gastroenterologist if you have not heard about the biopsies in 3 weeks.  Our staff will call the home number listed on your records the next business day following your procedure to check on you and address any questions or concerns that you may have at that time regarding the information given to you following your procedure. This is a courtesy call and so if there is no answer at the home number and we have not heard from you through the emergency physician on call, we will assume that you have returned to your regular daily activities without incident.  SIGNATURES/CONFIDENTIALITY: You and/or your care   partner have signed paperwork which will be entered into your electronic medical record.  These signatures attest to the fact that that the information above on your After Visit Summary has been reviewed and is understood.  Full responsibility of the confidentiality of this discharge information lies with you and/or your care-partner.YOU HAD AN ENDOSCOPIC PROCEDURE TODAY AT [facility]: Refer to the procedure report that was given to you for any specific questions about what was found during the examination.   If the procedure report does not answer your questions, please call your gastroenterologist to clarify.  If you requested that your care partner not be given the details of your procedure findings, then the procedure report has been included in a sealed envelope for you to review at your convenience later.  YOU SHOULD EXPECT: Some feelings of bloating in the abdomen. Passage of more gas than usual.  Walking can help get rid of the air that was put into your GI tract during the procedure and reduce the bloating. If you had a lower endoscopy (such as a colonoscopy or flexible sigmoidoscopy) you may notice spotting of blood in your stool or on the toilet paper. If you underwent a bowel prep for your procedure, then you may not have a normal bowel movement for a few days.  DIET: Your first meal following the procedure should be a light meal and then it is ok to progress to your normal diet.  A half-sandwich or bowl of soup is an example of a good first meal.  Heavy or fried foods are harder to digest and may make you feel nauseous or bloated.  Likewise meals heavy in dairy and vegetables can cause extra gas to form and this can also increase the bloating.  Drink plenty of fluids but you should avoid alcoholic beverages for 24 hours.  ACTIVITY: Your care partner should take you home directly after the procedure.  You should plan to take it easy, moving slowly for the rest of the day.  You can resume normal activity the day after the procedure however you should NOT DRIVE or use heavy machinery for 24 hours (because of the sedation medicines used during the test).    SYMPTOMS TO REPORT IMMEDIATELY: A gastroenterologist can be reached at any hour.  During normal business hours, 8:30 AM to 5:00 PM Monday through Friday, call [phone].  After hours and on weekends, please call the GI answering service at [phone] who will take a message and have the physician on call contact you.   Following lower  endoscopy (colonoscopy or flexible sigmoidoscopy):  Excessive amounts of blood in the stool  Significant tenderness or worsening of abdominal pains  Swelling of the abdomen that is new, acute  Fever of 100F or higher  FFOLLOW UP: If any biopsies were taken you will be contacted by phone or by letter within the next 1-3 weeks.  Call your gastroenterologist if you have not heard about the biopsies in 3 weeks.  Our staff will call the home number listed on your records the next business day following your procedure to check on you and address any questions or concerns that you may have at that time regarding the information given to you following your procedure. This is a courtesy call and so if there is no answer at the home number and we have not heard from you through the emergency physician on call, we will assume that you have returned to your regular daily activities  without incident.  SIGNATURES/CONFIDENTIALITY: You and/or your care partner have signed paperwork which will be entered into your electronic medical record.  These signatures attest to the fact that that the information above on your After Visit Summary has been reviewed and is understood.  Full responsibility of the confidentiality of this discharge information lies with you and/or your care-partner.  Polyp and diverticulosis information given. High fiber diet information given.

## 2014-06-05 NOTE — Op Note (Signed)
[place]  [address] [place], [address]   COLONOSCOPY PROCEDURE REPORT  PATIENT: Cindy, Perkins  MR#: [id number] BIRTHDATE: 1961/05/27 , 53  yrs. old GENDER: female ENDOSCOPIST: Jerene Bears, MD REFERRED [id number] Charlett Blake, M.D. PROCEDURE DATE:  06/05/2014 PROCEDURE:   Colonoscopy with snare polypectomy First Screening Colonoscopy - Avg.  risk and is 50 yrs.  old or older Yes.  Prior Negative Screening - Now for repeat screening. N/A  History of Adenoma - Now for follow-up colonoscopy & has been > or = to 3 yrs.  N/A  Polyps Removed Today? Yes. ASA CLASS:   Class II INDICATIONS:average risk for colorectal cancer and first colonoscopy. MEDICATIONS: Monitored anesthesia care and Propofol 500 mg IV  DESCRIPTION OF PROCEDURE:   After the risks benefits and alternatives of the procedure were thoroughly explained, informed consent was obtained.  The digital rectal exam revealed no rectal mass.   The LB [id number]  endoscope was introduced through the anus and advanced to the cecum, which was identified by both the appendix and ileocecal valve. No adverse events experienced. The quality of the prep was good, using MoviPrep  The instrument was then slowly withdrawn as the colon was fully examined.  COLON FINDINGS: Two sessile polyps measuring 5 mm in size were found in the sigmoid colon.  Polypectomies were performed with cold forceps.  The resection was complete, the polyp tissue was completely retrieved and sent to histology.   There was mild diverticulosis noted in the ascending colon and sigmoid colon. Retroflexed views revealed internal hemorrhoids. The time to cecum=2 minutes 43 seconds.  Withdrawal time=14 minutes 50 seconds. The scope was withdrawn and the procedure completed. COMPLICATIONS: There were no immediate complications.  ENDOSCOPIC IMPRESSION: 1.   Two sessile polyps were found in the sigmoid colon; polypectomies were performed with cold  forceps 2.   Mild diverticulosis was noted in the ascending colon and sigmoid colon  RECOMMENDATIONS: 1.  Await pathology results 2.  High fiber diet 3.  If the polyps removed today are proven to be adenomatous (pre-cancerous) polyps, you will need a repeat colonoscopy in 5 years.  Otherwise you should continue to follow colorectal cancer screening guidelines for "routine risk" patients with colonoscopy in 10 years.  You will receive a letter within 1-2 weeks with the results of your biopsy as well as final recommendations.  Please call my office if you have not received a letter after 3 weeks.  eSigned:  Jerene Bears, MD 06/05/2014 2:02 PM   cc:  The Patient, Dr. Willette Alma, MD

## 2014-06-05 NOTE — Progress Notes (Signed)
Procedure ends, to recovery, report given and VSS. 

## 2014-06-05 NOTE — Progress Notes (Signed)
Called to room to assist during endoscopic procedure.  Patient ID and intended procedure confirmed with present staff. Received instructions for my participation in the procedure from the performing physician.  

## 2014-06-08 ENCOUNTER — Telehealth: Payer: Self-pay | Admitting: *Deleted

## 2014-06-08 NOTE — Telephone Encounter (Signed)
  Follow up Call-  Call back number 06/05/2014  Post procedure Call Back phone  # [phone]  Permission to leave phone message Yes     Patient questions:  Do you have a fever, pain , or abdominal swelling? No. Pain Score  0 *  Have you tolerated food without any problems? Yes.    Have you been able to return to your normal activities? Yes.    Do you have any questions about your discharge instructions: Diet   No. Medications  No. Follow up visit  No.  Do you have questions or concerns about your Care? No.  Actions: * If pain score is 4 or above: No action needed, pain <4.

## 2014-06-10 ENCOUNTER — Ambulatory Visit
Admission: RE | Admit: 2014-06-10 | Discharge: 2014-06-10 | Disposition: A | Discharge status: Home / Self Care | Payer: Managed Care, Other (non HMO) | Source: Ambulatory Visit

## 2014-06-10 DIAGNOSIS — Z1231 Encounter for screening mammogram for malignant neoplasm of breast: Secondary | ICD-10-CM

## 2014-06-15 ENCOUNTER — Encounter: Payer: Self-pay | Admitting: Internal Medicine

## 2014-06-19 ENCOUNTER — Other Ambulatory Visit: Payer: Self-pay

## 2014-06-19 MED ORDER — LEVOTHYROXINE SODIUM 100 MCG PO TABS: 100.0000 ug | ORAL_TABLET | Freq: Every day | ORAL | Status: DC

## 2014-09-17 ENCOUNTER — Other Ambulatory Visit: Payer: Self-pay | Admitting: Family Medicine

## 2014-09-17 MED ORDER — OLMESARTAN MEDOXOMIL-HCTZ 20-12.5 MG PO TABS: 1.0000 | ORAL_TABLET | Freq: Every day | ORAL | Status: DC

## 2014-09-24 ENCOUNTER — Ambulatory Visit: Payer: Managed Care, Other (non HMO) | Admitting: Family Medicine

## 2014-10-02 ENCOUNTER — Ambulatory Visit (INDEPENDENT_AMBULATORY_CARE_PROVIDER_SITE_OTHER): Payer: Managed Care, Other (non HMO) | Admitting: Family Medicine

## 2014-10-02 ENCOUNTER — Encounter: Payer: Self-pay | Admitting: Family Medicine

## 2014-10-02 VITALS — BP 110/72 | HR 67 | Temp 98.0°F | Ht 64.0 in | Wt 173.1 lb

## 2014-10-02 DIAGNOSIS — R739 Hyperglycemia, unspecified: Secondary | ICD-10-CM | POA: Diagnosis not present

## 2014-10-02 DIAGNOSIS — E039 Hypothyroidism, unspecified: Secondary | ICD-10-CM

## 2014-10-02 DIAGNOSIS — I10 Essential (primary) hypertension: Secondary | ICD-10-CM | POA: Diagnosis not present

## 2014-10-02 DIAGNOSIS — E782 Mixed hyperlipidemia: Secondary | ICD-10-CM

## 2014-10-02 LAB — LIPID PANEL
Cholesterol: 181 mg/dL (ref 0–200)
HDL: 78.5 mg/dL (ref >39.00)
LDL CALC: 86 mg/dL (ref 0–99)
NonHDL: 102.5
Total CHOL/HDL Ratio: 2
Triglycerides: 83 mg/dL (ref 0.0–149.0)
VLDL: 16.6 mg/dL (ref 0.0–40.0)

## 2014-10-02 LAB — COMPREHENSIVE METABOLIC PANEL
ALT: 12 U/L (ref 0–35)
AST: 14 U/L (ref 0–37)
Albumin: 4.4 g/dL (ref 3.5–5.2)
Alkaline Phosphatase: 61 U/L (ref 39–117)
BILIRUBIN TOTAL: 0.7 mg/dL (ref 0.2–1.2)
BUN: 20 mg/dL (ref 6–23)
CO2: 31 mEq/L (ref 19–32)
Calcium: 9.6 mg/dL (ref 8.4–10.5)
Chloride: 101 mEq/L (ref 96–112)
Creatinine, Ser: 0.9 mg/dL (ref 0.40–1.20)
GFR: 69.85 mL/min (ref >60.00)
Glucose, Bld: 88 mg/dL (ref 70–99)
Potassium: 3.8 mEq/L (ref 3.5–5.1)
Sodium: 137 mEq/L (ref 135–145)
Total Protein: 7.5 g/dL (ref 6.0–8.3)

## 2014-10-02 LAB — HEMOGLOBIN A1C: HEMOGLOBIN A1C: 5 % (ref 4.6–6.5)

## 2014-10-02 LAB — CBC
HCT: 38.3 % (ref 36.0–46.0)
Hemoglobin: 13 g/dL (ref 12.0–15.0)
MCHC: 34 g/dL (ref 30.0–36.0)
MCV: 94.3 fl (ref 78.0–100.0)
PLATELETS: 190 10*3/uL (ref 150.0–400.0)
RBC: 4.06 Mil/uL (ref 3.87–5.11)
RDW: 14 % (ref 11.5–15.5)
WBC: 5.2 10*3/uL (ref 4.0–10.5)

## 2014-10-02 LAB — TSH: TSH: 3.96 u[IU]/mL (ref 0.35–4.50)

## 2014-10-02 NOTE — Patient Instructions (Signed)

## 2014-10-02 NOTE — Progress Notes (Signed)
Pre visit review using our clinic review tool, if applicable. No additional management support is needed unless otherwise documented below in the visit note. 

## 2014-10-15 NOTE — Assessment & Plan Note (Signed)
On Levothyroxine, continue to monitor 

## 2014-10-15 NOTE — Progress Notes (Signed)
Cindy Perkins  [id number] May 12, 1961 10/15/2014      Progress Note-Follow Up  Subjective  Chief Complaint  Chief Complaint  Patient presents with  . Follow-up    HPI  Patient is a 54 y.o. female in today for routine medical care. Patient is in today for follow-up. Feels well. No recent illness or acute concerns. Denies any polyuria or polydipsia. Denies CP/palp/SOB/HA/congestion/fevers/GI or GU c/o. Taking meds as prescribed  Past Medical History  Diagnosis Date  . HYPOTHYROIDISM 03/28/2007  . ALLERGIC RHINITIS 03/28/2007  . Postmenopausal bleeding 05/03/2009  . [place], FEMALE 04/03/2008  . Chicken pox as a child  . Hyperglycemia 03/29/2014  . HYPERTENSION, SYSTOLIC, BORDERLINE 08/12/5168    on medicine for BP  . Allergy     Past Surgical History  Procedure Laterality Date  . Tubal ligation  1995  . Knee surgery Left     surgical repair of injury    Family History  Problem Relation Age of Onset  . Dementia Mother   . Hypertension Mother   . Hyperlipidemia Mother   . COPD Mother     smoker but quit for 30 yrs  . Dementia Father   . Diabetes Father     type 2  . Hypertension Father   . Obesity Brother   . Cancer Brother 58    bone  . Heart attack Maternal Grandfather   . Diabetes Paternal Grandfather     type 2  . Obesity Brother   . Pulmonary embolism Brother   . Colon cancer Neg Hx   . Rectal cancer Neg Hx   . Stomach cancer Neg Hx     History   Social History  . Marital Status: Married    Spouse Name: N/A  . Number of Children: N/A  . Years of Education: N/A   Occupational History  . Not on file.   Social History Main Topics  . Smoking status: Never Smoker   . Smokeless tobacco: Never Used  . Alcohol Use: Yes     Comment: 12 beers weekly  . Drug Use: No  . Sexual Activity: Yes    Birth Control/ Protection: Post-menopausal     Comment: lives with husband, works for [employer], no dietary restrictions   Other Topics Concern   . Not on file   Social History Narrative    Current Outpatient Prescriptions on File Prior to Visit  Medication Sig Dispense Refill  . Calcium Carbonate-Vitamin D (CALCIUM-VITAMIN D) 500-200 MG-UNIT per tablet Take 1 tablet by mouth 2 (two) times daily with meals.      Marland Kitchen levothyroxine (SYNTHROID, LEVOTHROID) 100 MCG tablet TAKE 1 TABLET (100 MCG TOTAL) BY MOUTH DAILY BEFORE BREAKFAST. 90 tablet 0  . Multiple Vitamin (MULTIVITAMIN) capsule Take 1 capsule by mouth daily.      Marland Kitchen olmesartan-hydrochlorothiazide (BENICAR HCT) 20-12.5 MG per tablet Take 1 tablet by mouth daily. 90 tablet 1  . ibuprofen (ADVIL,MOTRIN) 200 MG tablet Take 400 mg by mouth every 6 (six) hours as needed.     No current facility-administered medications on file prior to visit.    Allergies  Allergen Reactions  . Penicillins Swelling    Review of Systems  Review of Systems  Constitutional: Negative for fever and malaise/fatigue.  HENT: Negative for congestion.   Eyes: Negative for discharge.  Respiratory: Negative for shortness of breath.   Cardiovascular: Negative for chest pain, palpitations and leg swelling.  Gastrointestinal: Negative for nausea, abdominal pain and diarrhea.  Genitourinary: Negative for dysuria.  Musculoskeletal: Negative for falls.  Skin: Negative for rash.  Neurological: Negative for loss of consciousness and headaches.  Endo/Heme/Allergies: Negative for polydipsia.  Psychiatric/Behavioral: Negative for depression and suicidal ideas. The patient is not nervous/anxious and does not have insomnia.     Objective  BP 110/72 mmHg  Pulse 67  Temp(Src) 98 F (36.7 C) (Oral)  Ht 5\' 4"  (1.626 m)  Wt 173 lb 2 oz (78.529 kg)  BMI 29.70 kg/m2  SpO2 98%  Physical Exam  Physical Exam  Constitutional: She is oriented to person, place, and time and well-developed, well-nourished, and in no distress. No distress.  HENT:  Head: Normocephalic and atraumatic.  Eyes: Conjunctivae are  normal.  Neck: Neck supple. No thyromegaly present.  Cardiovascular: Normal rate, regular rhythm and normal heart sounds.   No murmur heard. Pulmonary/Chest: Effort normal and breath sounds normal. She has no wheezes.  Abdominal: She exhibits no distension and no mass.  Musculoskeletal: She exhibits no edema.  Lymphadenopathy:    She has no cervical adenopathy.  Neurological: She is alert and oriented to person, place, and time.  Skin: Skin is warm and dry. No rash noted. She is not diaphoretic.  Psychiatric: Memory, affect and judgment normal.    Lab Results  Component Value Date   TSH 3.96 10/02/2014   Lab Results  Component Value Date   WBC 5.2 10/02/2014   HGB 13.0 10/02/2014   HCT 38.3 10/02/2014   MCV 94.3 10/02/2014   PLT 190.0 10/02/2014   Lab Results  Component Value Date   CREATININE 0.90 10/02/2014   BUN 20 10/02/2014   NA 137 10/02/2014   K 3.8 10/02/2014   CL 101 10/02/2014   CO2 31 10/02/2014   Lab Results  Component Value Date   ALT 12 10/02/2014   AST 14 10/02/2014   ALKPHOS 61 10/02/2014   BILITOT 0.7 10/02/2014   Lab Results  Component Value Date   CHOL 181 10/02/2014   Lab Results  Component Value Date   HDL 78.50 10/02/2014   Lab Results  Component Value Date   LDLCALC 86 10/02/2014   Lab Results  Component Value Date   TRIG 83.0 10/02/2014   Lab Results  Component Value Date   CHOLHDL 2 10/02/2014     Assessment & Plan  Essential hypertension Well controlled, no changes to meds. Encouraged heart healthy diet such as the DASH diet and exercise as tolerated.    Hypothyroidism On Levothyroxine, continue to monitor   Hyperglycemia minimize simple carbs. Increase exercise as tolerated.

## 2014-10-15 NOTE — Assessment & Plan Note (Signed)
Well controlled, no changes to meds. Encouraged heart healthy diet such as the DASH diet and exercise as tolerated.  °

## 2014-10-15 NOTE — Assessment & Plan Note (Signed)
minimize simple carbs. Increase exercise as tolerated.  

## 2015-01-04 ENCOUNTER — Other Ambulatory Visit: Payer: Self-pay | Admitting: Family Medicine

## 2015-03-21 ENCOUNTER — Other Ambulatory Visit: Payer: Self-pay | Admitting: Family Medicine

## 2015-03-29 ENCOUNTER — Other Ambulatory Visit (INDEPENDENT_AMBULATORY_CARE_PROVIDER_SITE_OTHER): Payer: Managed Care, Other (non HMO)

## 2015-03-29 DIAGNOSIS — E782 Mixed hyperlipidemia: Secondary | ICD-10-CM

## 2015-03-29 DIAGNOSIS — R739 Hyperglycemia, unspecified: Secondary | ICD-10-CM

## 2015-03-29 DIAGNOSIS — I10 Essential (primary) hypertension: Secondary | ICD-10-CM | POA: Diagnosis not present

## 2015-03-29 LAB — LIPID PANEL
CHOL/HDL RATIO: 2
Cholesterol: 180 mg/dL (ref 0–200)
HDL: 88.8 mg/dL (ref >39.00)
LDL Cholesterol: 80 mg/dL (ref 0–99)
NonHDL: 91.35
Triglycerides: 55 mg/dL (ref 0.0–149.0)
VLDL: 11 mg/dL (ref 0.0–40.0)

## 2015-03-29 LAB — CBC
HEMATOCRIT: 40.4 % (ref 36.0–46.0)
HEMOGLOBIN: 13.7 g/dL (ref 12.0–15.0)
MCHC: 33.8 g/dL (ref 30.0–36.0)
MCV: 96.3 fl (ref 78.0–100.0)
PLATELETS: 181 10*3/uL (ref 150.0–400.0)
RBC: 4.2 Mil/uL (ref 3.87–5.11)
RDW: 13.9 % (ref 11.5–15.5)
WBC: 5.8 10*3/uL (ref 4.0–10.5)

## 2015-03-29 LAB — COMPREHENSIVE METABOLIC PANEL
ALT: 17 U/L (ref 0–35)
AST: 17 U/L (ref 0–37)
Albumin: 4.1 g/dL (ref 3.5–5.2)
Alkaline Phosphatase: 57 U/L (ref 39–117)
BUN: 16 mg/dL (ref 6–23)
CALCIUM: 9.3 mg/dL (ref 8.4–10.5)
CHLORIDE: 101 meq/L (ref 96–112)
CO2: 31 meq/L (ref 19–32)
Creatinine, Ser: 0.81 mg/dL (ref 0.40–1.20)
GFR: 78.74 mL/min (ref >60.00)
Glucose, Bld: 85 mg/dL (ref 70–99)
POTASSIUM: 4.4 meq/L (ref 3.5–5.1)
Sodium: 140 mEq/L (ref 135–145)
Total Bilirubin: 0.5 mg/dL (ref 0.2–1.2)
Total Protein: 7.3 g/dL (ref 6.0–8.3)

## 2015-03-29 LAB — HEMOGLOBIN A1C: Hgb A1c MFr Bld: 5.1 % (ref 4.6–6.5)

## 2015-03-29 LAB — TSH: TSH: 2.84 u[IU]/mL (ref 0.35–4.50)

## 2015-04-05 ENCOUNTER — Other Ambulatory Visit (HOSPITAL_COMMUNITY)
Admission: RE | Admit: 2015-04-05 | Discharge: 2015-04-05 | Disposition: A | Discharge status: Home / Self Care | Payer: Managed Care, Other (non HMO) | Source: Ambulatory Visit | Attending: Family Medicine | Admitting: Family Medicine

## 2015-04-05 ENCOUNTER — Encounter: Payer: Self-pay | Admitting: Family Medicine

## 2015-04-05 ENCOUNTER — Ambulatory Visit (INDEPENDENT_AMBULATORY_CARE_PROVIDER_SITE_OTHER): Payer: Managed Care, Other (non HMO) | Admitting: Family Medicine

## 2015-04-05 VITALS — BP 128/80 | HR 57 | Temp 98.1°F | Ht 65.0 in | Wt 172.1 lb

## 2015-04-05 DIAGNOSIS — L578 Other skin changes due to chronic exposure to nonionizing radiation: Secondary | ICD-10-CM

## 2015-04-05 DIAGNOSIS — Z124 Encounter for screening for malignant neoplasm of cervix: Secondary | ICD-10-CM | POA: Insufficient documentation

## 2015-04-05 DIAGNOSIS — Z0000 Encounter for general adult medical examination without abnormal findings: Secondary | ICD-10-CM | POA: Diagnosis not present

## 2015-04-05 DIAGNOSIS — Z01419 Encounter for gynecological examination (general) (routine) without abnormal findings: Secondary | ICD-10-CM | POA: Insufficient documentation

## 2015-04-05 DIAGNOSIS — I10 Essential (primary) hypertension: Secondary | ICD-10-CM

## 2015-04-05 DIAGNOSIS — E039 Hypothyroidism, unspecified: Secondary | ICD-10-CM

## 2015-04-05 DIAGNOSIS — K5790 Diverticulosis of intestine, part unspecified, without perforation or abscess without bleeding: Secondary | ICD-10-CM

## 2015-04-05 HISTORY — DX: Encounter for screening for malignant neoplasm of cervix: Z12.4

## 2015-04-05 MED ORDER — LEVOTHYROXINE SODIUM 100 MCG PO TABS: ORAL_TABLET | ORAL | Status: DC

## 2015-04-05 MED ORDER — OLMESARTAN MEDOXOMIL-HCTZ 20-12.5 MG PO TABS: 1.0000 | ORAL_TABLET | Freq: Every day | ORAL | Status: DC

## 2015-04-05 NOTE — Patient Instructions (Signed)
Preventive Care for Adults A healthy lifestyle and preventive care can promote health and wellness. Preventive health guidelines for women include the following key practices.  A routine yearly physical is a good way to check with your health care provider about your health and preventive screening. It is a chance to share any concerns and updates on your health and to receive a thorough exam.  Visit your dentist for a routine exam and preventive care every 6 months. Brush your teeth twice a day and floss once a day. Good oral hygiene prevents tooth decay and gum disease.  The frequency of eye exams is based on your age, health, family medical history, use of contact lenses, and other factors. Follow your health care provider's recommendations for frequency of eye exams.  Eat a healthy diet. Foods like vegetables, fruits, whole grains, low-fat dairy products, and lean protein foods contain the nutrients you need without too many calories. Decrease your intake of foods high in solid fats, added sugars, and salt. Eat the right amount of calories for you.Get information about a proper diet from your health care provider, if necessary.  Regular physical exercise is one of the most important things you can do for your health. Most adults should get at least 150 minutes of moderate-intensity exercise (any activity that increases your heart rate and causes you to sweat) each week. In addition, most adults need muscle-strengthening exercises on 2 or more days a week.  Maintain a healthy weight. The body mass index (BMI) is a screening tool to identify possible weight problems. It provides an estimate of body fat based on height and weight. Your health care provider can find your BMI and can help you achieve or maintain a healthy weight.For adults 20 years and older:  A BMI below 18.5 is considered underweight.  A BMI of 18.5 to 24.9 is normal.  A BMI of 25 to 29.9 is considered overweight.  A BMI of  30 and above is considered obese.  Maintain normal blood lipids and cholesterol levels by exercising and minimizing your intake of saturated fat. Eat a balanced diet with plenty of fruit and vegetables. Blood tests for lipids and cholesterol should begin at age 76 and be repeated every 5 years. If your lipid or cholesterol levels are high, you are over 50, or you are at high risk for heart disease, you may need your cholesterol levels checked more frequently.Ongoing high lipid and cholesterol levels should be treated with medicines if diet and exercise are not working.  If you smoke, find out from your health care provider how to quit. If you do not use tobacco, do not start.  Lung cancer screening is recommended for adults aged 22-80 years who are at high risk for developing lung cancer because of a history of smoking. A yearly low-dose CT scan of the lungs is recommended for people who have at least a 30-pack-year history of smoking and are a current smoker or have quit within the past 15 years. A pack year of smoking is smoking an average of 1 pack of cigarettes a day for 1 year (for example: 1 pack a day for 30 years or 2 packs a day for 15 years). Yearly screening should continue until the smoker has stopped smoking for at least 15 years. Yearly screening should be stopped for people who develop a health problem that would prevent them from having lung cancer treatment.  If you are pregnant, do not drink alcohol. If you are breastfeeding,  be very cautious about drinking alcohol. If you are not pregnant and choose to drink alcohol, do not have more than 1 drink per day. One drink is considered to be 12 ounces (355 mL) of beer, 5 ounces (148 mL) of wine, or 1.5 ounces (44 mL) of liquor.  Avoid use of street drugs. Do not share needles with anyone. Ask for help if you need support or instructions about stopping the use of drugs.  High blood pressure causes heart disease and increases the risk of  stroke. Your blood pressure should be checked at least every 1 to 2 years. Ongoing high blood pressure should be treated with medicines if weight loss and exercise do not work.  If you are 75-52 years old, ask your health care provider if you should take aspirin to prevent strokes.  Diabetes screening involves taking a blood sample to check your fasting blood sugar level. This should be done once every 3 years, after age 15, if you are within normal weight and without risk factors for diabetes. Testing should be considered at a younger age or be carried out more frequently if you are overweight and have at least 1 risk factor for diabetes.  Breast cancer screening is essential preventive care for women. You should practice "breast self-awareness." This means understanding the normal appearance and feel of your breasts and may include breast self-examination. Any changes detected, no matter how small, should be reported to a health care provider. Women in their 58s and 30s should have a clinical breast exam (CBE) by a health care provider as part of a regular health exam every 1 to 3 years. After age 16, women should have a CBE every year. Starting at age 53, women should consider having a mammogram (breast X-ray test) every year. Women who have a family history of breast cancer should talk to their health care provider about genetic screening. Women at a high risk of breast cancer should talk to their health care providers about having an MRI and a mammogram every year.  Breast cancer gene (BRCA)-related cancer risk assessment is recommended for women who have family members with BRCA-related cancers. BRCA-related cancers include breast, ovarian, tubal, and peritoneal cancers. Having family members with these cancers may be associated with an increased risk for harmful changes (mutations) in the breast cancer genes BRCA1 and BRCA2. Results of the assessment will determine the need for genetic counseling and  BRCA1 and BRCA2 testing.  Routine pelvic exams to screen for cancer are no longer recommended for nonpregnant women who are considered low risk for cancer of the pelvic organs (ovaries, uterus, and vagina) and who do not have symptoms. Ask your health care provider if a screening pelvic exam is right for you.  If you have had past treatment for cervical cancer or a condition that could lead to cancer, you need Pap tests and screening for cancer for at least 20 years after your treatment. If Pap tests have been discontinued, your risk factors (such as having a new sexual partner) need to be reassessed to determine if screening should be resumed. Some women have medical problems that increase the chance of getting cervical cancer. In these cases, your health care provider may recommend more frequent screening and Pap tests.  The HPV test is an additional test that may be used for cervical cancer screening. The HPV test looks for the virus that can cause the cell changes on the cervix. The cells collected during the Pap test can be  tested for HPV. The HPV test could be used to screen women aged 30 years and older, and should be used in women of any age who have unclear Pap test results. After the age of 30, women should have HPV testing at the same frequency as a Pap test.  Colorectal cancer can be detected and often prevented. Most routine colorectal cancer screening begins at the age of 50 years and continues through age 75 years. However, your health care provider may recommend screening at an earlier age if you have risk factors for colon cancer. On a yearly basis, your health care provider may provide home test kits to check for hidden blood in the stool. Use of a small camera at the end of a tube, to directly examine the colon (sigmoidoscopy or colonoscopy), can detect the earliest forms of colorectal cancer. Talk to your health care provider about this at age 50, when routine screening begins. Direct  exam of the colon should be repeated every 5-10 years through age 75 years, unless early forms of pre-cancerous polyps or small growths are found.  People who are at an increased risk for hepatitis B should be screened for this virus. You are considered at high risk for hepatitis B if:  You were born in a country where hepatitis B occurs often. Talk with your health care provider about which countries are considered high risk.  Your parents were born in a high-risk country and you have not received a shot to protect against hepatitis B (hepatitis B vaccine).  You have HIV or AIDS.  You use needles to inject street drugs.  You live with, or have sex with, someone who has hepatitis B.  You get hemodialysis treatment.  You take certain medicines for conditions like cancer, organ transplantation, and autoimmune conditions.  Hepatitis C blood testing is recommended for all people born from 1945 through 1965 and any individual with known risks for hepatitis C.  Practice safe sex. Use condoms and avoid high-risk sexual practices to reduce the spread of sexually transmitted infections (STIs). STIs include gonorrhea, chlamydia, syphilis, trichomonas, herpes, HPV, and human immunodeficiency virus (HIV). Herpes, HIV, and HPV are viral illnesses that have no cure. They can result in disability, cancer, and death.  You should be screened for sexually transmitted illnesses (STIs) including gonorrhea and chlamydia if:  You are sexually active and are younger than 24 years.  You are older than 24 years and your health care provider tells you that you are at risk for this type of infection.  Your sexual activity has changed since you were last screened and you are at an increased risk for chlamydia or gonorrhea. Ask your health care provider if you are at risk.  If you are at risk of being infected with HIV, it is recommended that you take a prescription medicine daily to prevent HIV infection. This is  called preexposure prophylaxis (PrEP). You are considered at risk if:  You are a heterosexual woman, are sexually active, and are at increased risk for HIV infection.  You take drugs by injection.  You are sexually active with a partner who has HIV.  Talk with your health care provider about whether you are at high risk of being infected with HIV. If you choose to begin PrEP, you should first be tested for HIV. You should then be tested every 3 months for as long as you are taking PrEP.  Osteoporosis is a disease in which the bones lose minerals and strength   with aging. This can result in serious bone fractures or breaks. The risk of osteoporosis can be identified using a bone density scan. Women ages 65 years and over and women at risk for fractures or osteoporosis should discuss screening with their health care providers. Ask your health care provider whether you should take a calcium supplement or vitamin D to reduce the rate of osteoporosis.  Menopause can be associated with physical symptoms and risks. Hormone replacement therapy is available to decrease symptoms and risks. You should talk to your health care provider about whether hormone replacement therapy is right for you.  Use sunscreen. Apply sunscreen liberally and repeatedly throughout the day. You should seek shade when your shadow is shorter than you. Protect yourself by wearing long sleeves, pants, a wide-brimmed hat, and sunglasses year round, whenever you are outdoors.  Once a month, do a whole body skin exam, using a mirror to look at the skin on your back. Tell your health care provider of new moles, moles that have irregular borders, moles that are larger than a pencil eraser, or moles that have changed in shape or color.  Stay current with required vaccines (immunizations).  Influenza vaccine. All adults should be immunized every year.  Tetanus, diphtheria, and acellular pertussis (Td, Tdap) vaccine. Pregnant women should  receive 1 dose of Tdap vaccine during each pregnancy. The dose should be obtained regardless of the length of time since the last dose. Immunization is preferred during the 27th-36th week of gestation. An adult who has not previously received Tdap or who does not know her vaccine status should receive 1 dose of Tdap. This initial dose should be followed by tetanus and diphtheria toxoids (Td) booster doses every 10 years. Adults with an unknown or incomplete history of completing a 3-dose immunization series with Td-containing vaccines should begin or complete a primary immunization series including a Tdap dose. Adults should receive a Td booster every 10 years.  Varicella vaccine. An adult without evidence of immunity to varicella should receive 2 doses or a second dose if she has previously received 1 dose. Pregnant females who do not have evidence of immunity should receive the first dose after pregnancy. This first dose should be obtained before leaving the health care facility. The second dose should be obtained 4-8 weeks after the first dose.  Human papillomavirus (HPV) vaccine. Females aged 13-26 years who have not received the vaccine previously should obtain the 3-dose series. The vaccine is not recommended for use in pregnant females. However, pregnancy testing is not needed before receiving a dose. If a female is found to be pregnant after receiving a dose, no treatment is needed. In that case, the remaining doses should be delayed until after the pregnancy. Immunization is recommended for any person with an immunocompromised condition through the age of 26 years if she did not get any or all doses earlier. During the 3-dose series, the second dose should be obtained 4-8 weeks after the first dose. The third dose should be obtained 24 weeks after the first dose and 16 weeks after the second dose.  Zoster vaccine. One dose is recommended for adults aged 60 years or older unless certain conditions are  present.  Measles, mumps, and rubella (MMR) vaccine. Adults born before 1957 generally are considered immune to measles and mumps. Adults born in 1957 or later should have 1 or more doses of MMR vaccine unless there is a contraindication to the vaccine or there is laboratory evidence of immunity to   each of the three diseases. A routine second dose of MMR vaccine should be obtained at least 28 days after the first dose for students attending postsecondary schools, health care workers, or international travelers. People who received inactivated measles vaccine or an unknown type of measles vaccine during 1963-1967 should receive 2 doses of MMR vaccine. People who received inactivated mumps vaccine or an unknown type of mumps vaccine before 1979 and are at high risk for mumps infection should consider immunization with 2 doses of MMR vaccine. For females of childbearing age, rubella immunity should be determined. If there is no evidence of immunity, females who are not pregnant should be vaccinated. If there is no evidence of immunity, females who are pregnant should delay immunization until after pregnancy. Unvaccinated health care workers born before 1957 who lack laboratory evidence of measles, mumps, or rubella immunity or laboratory confirmation of disease should consider measles and mumps immunization with 2 doses of MMR vaccine or rubella immunization with 1 dose of MMR vaccine.  Pneumococcal 13-valent conjugate (PCV13) vaccine. When indicated, a person who is uncertain of her immunization history and has no record of immunization should receive the PCV13 vaccine. An adult aged 19 years or older who has certain medical conditions and has not been previously immunized should receive 1 dose of PCV13 vaccine. This PCV13 should be followed with a dose of pneumococcal polysaccharide (PPSV23) vaccine. The PPSV23 vaccine dose should be obtained at least 8 weeks after the dose of PCV13 vaccine. An adult aged 19  years or older who has certain medical conditions and previously received 1 or more doses of PPSV23 vaccine should receive 1 dose of PCV13. The PCV13 vaccine dose should be obtained 1 or more years after the last PPSV23 vaccine dose.  Pneumococcal polysaccharide (PPSV23) vaccine. When PCV13 is also indicated, PCV13 should be obtained first. All adults aged 65 years and older should be immunized. An adult younger than age 65 years who has certain medical conditions should be immunized. Any person who resides in a nursing home or long-term care facility should be immunized. An adult smoker should be immunized. People with an immunocompromised condition and certain other conditions should receive both PCV13 and PPSV23 vaccines. People with human immunodeficiency virus (HIV) infection should be immunized as soon as possible after diagnosis. Immunization during chemotherapy or radiation therapy should be avoided. Routine use of PPSV23 vaccine is not recommended for American Indians, [place] Natives, or people younger than 65 years unless there are medical conditions that require PPSV23 vaccine. When indicated, people who have unknown immunization and have no record of immunization should receive PPSV23 vaccine. One-time revaccination 5 years after the first dose of PPSV23 is recommended for people aged 19-64 years who have chronic kidney failure, nephrotic syndrome, asplenia, or immunocompromised conditions. People who received 1-2 doses of PPSV23 before age 65 years should receive another dose of PPSV23 vaccine at age 65 years or later if at least 5 years have passed since the previous dose. Doses of PPSV23 are not needed for people immunized with PPSV23 at or after age 65 years.  Meningococcal vaccine. Adults with asplenia or persistent complement component deficiencies should receive 2 doses of quadrivalent meningococcal conjugate (MenACWY-D) vaccine. The doses should be obtained at least 2 months apart.  Microbiologists working with certain meningococcal bacteria, military recruits, people at risk during an outbreak, and people who travel to or live in countries with a high rate of meningitis should be immunized. A first-year college student up through age   21 years who is living in a residence hall should receive a dose if she did not receive a dose on or after her 16th birthday. Adults who have certain high-risk conditions should receive one or more doses of vaccine.  Hepatitis A vaccine. Adults who wish to be protected from this disease, have certain high-risk conditions, work with hepatitis A-infected animals, work in hepatitis A research labs, or travel to or work in countries with a high rate of hepatitis A should be immunized. Adults who were previously unvaccinated and who anticipate close contact with an international adoptee during the first 60 days after arrival in the [place] from a country with a high rate of hepatitis A should be immunized.  Hepatitis B vaccine. Adults who wish to be protected from this disease, have certain high-risk conditions, may be exposed to blood or other infectious body fluids, are household contacts or sex partners of hepatitis B positive people, are clients or workers in certain care facilities, or travel to or work in countries with a high rate of hepatitis B should be immunized.  Haemophilus influenzae type b (Hib) vaccine. A previously unvaccinated person with asplenia or sickle cell disease or having a scheduled splenectomy should receive 1 dose of Hib vaccine. Regardless of previous immunization, a recipient of a hematopoietic stem cell transplant should receive a 3-dose series 6-12 months after her successful transplant. Hib vaccine is not recommended for adults with HIV infection. Preventive Services / Frequency Ages 64 to 68 years  Blood pressure check.** / Every 1 to 2 years.  Lipid and cholesterol check.** / Every 5 years beginning at age  22.  Clinical breast exam.** / Every 3 years for women in their 88s and 53s.  BRCA-related cancer risk assessment.** / For women who have family members with a BRCA-related cancer (breast, ovarian, tubal, or peritoneal cancers).  Pap test.** / Every 2 years from ages 90 through 51. Every 3 years starting at age 21 through age 56 or 3 with a history of 3 consecutive normal Pap tests.  HPV screening.** / Every 3 years from ages 24 through ages 1 to 46 with a history of 3 consecutive normal Pap tests.  Hepatitis C blood test.** / For any individual with known risks for hepatitis C.  Skin self-exam. / Monthly.  Influenza vaccine. / Every year.  Tetanus, diphtheria, and acellular pertussis (Tdap, Td) vaccine.** / Consult your health care provider. Pregnant women should receive 1 dose of Tdap vaccine during each pregnancy. 1 dose of Td every 10 years.  Varicella vaccine.** / Consult your health care provider. Pregnant females who do not have evidence of immunity should receive the first dose after pregnancy.  HPV vaccine. / 3 doses over 6 months, if 72 and younger. The vaccine is not recommended for use in pregnant females. However, pregnancy testing is not needed before receiving a dose.  Measles, mumps, rubella (MMR) vaccine.** / You need at least 1 dose of MMR if you were born in 1957 or later. You may also need a 2nd dose. For females of childbearing age, rubella immunity should be determined. If there is no evidence of immunity, females who are not pregnant should be vaccinated. If there is no evidence of immunity, females who are pregnant should delay immunization until after pregnancy.  Pneumococcal 13-valent conjugate (PCV13) vaccine.** / Consult your health care provider.  Pneumococcal polysaccharide (PPSV23) vaccine.** / 1 to 2 doses if you smoke cigarettes or if you have certain conditions.  Meningococcal vaccine.** /  1 dose if you are age 19 to 21 years and a first-year college  student living in a residence hall, or have one of several medical conditions, you need to get vaccinated against meningococcal disease. You may also need additional booster doses.  Hepatitis A vaccine.** / Consult your health care provider.  Hepatitis B vaccine.** / Consult your health care provider.  Haemophilus influenzae type b (Hib) vaccine.** / Consult your health care provider. Ages 40 to 64 years  Blood pressure check.** / Every 1 to 2 years.  Lipid and cholesterol check.** / Every 5 years beginning at age 20 years.  Lung cancer screening. / Every year if you are aged 55-80 years and have a 30-pack-year history of smoking and currently smoke or have quit within the past 15 years. Yearly screening is stopped once you have quit smoking for at least 15 years or develop a health problem that would prevent you from having lung cancer treatment.  Clinical breast exam.** / Every year after age 40 years.  BRCA-related cancer risk assessment.** / For women who have family members with a BRCA-related cancer (breast, ovarian, tubal, or peritoneal cancers).  Mammogram.** / Every year beginning at age 40 years and continuing for as long as you are in good health. Consult with your health care provider.  Pap test.** / Every 3 years starting at age 30 years through age 65 or 70 years with a history of 3 consecutive normal Pap tests.  HPV screening.** / Every 3 years from ages 30 years through ages 65 to 70 years with a history of 3 consecutive normal Pap tests.  Fecal occult blood test (FOBT) of stool. / Every year beginning at age 50 years and continuing until age 75 years. You may not need to do this test if you get a colonoscopy every 10 years.  Flexible sigmoidoscopy or colonoscopy.** / Every 5 years for a flexible sigmoidoscopy or every 10 years for a colonoscopy beginning at age 50 years and continuing until age 75 years.  Hepatitis C blood test.** / For all people born from 1945 through  1965 and any individual with known risks for hepatitis C.  Skin self-exam. / Monthly.  Influenza vaccine. / Every year.  Tetanus, diphtheria, and acellular pertussis (Tdap/Td) vaccine.** / Consult your health care provider. Pregnant women should receive 1 dose of Tdap vaccine during each pregnancy. 1 dose of Td every 10 years.  Varicella vaccine.** / Consult your health care provider. Pregnant females who do not have evidence of immunity should receive the first dose after pregnancy.  Zoster vaccine.** / 1 dose for adults aged 60 years or older.  Measles, mumps, rubella (MMR) vaccine.** / You need at least 1 dose of MMR if you were born in 1957 or later. You may also need a 2nd dose. For females of childbearing age, rubella immunity should be determined. If there is no evidence of immunity, females who are not pregnant should be vaccinated. If there is no evidence of immunity, females who are pregnant should delay immunization until after pregnancy.  Pneumococcal 13-valent conjugate (PCV13) vaccine.** / Consult your health care provider.  Pneumococcal polysaccharide (PPSV23) vaccine.** / 1 to 2 doses if you smoke cigarettes or if you have certain conditions.  Meningococcal vaccine.** / Consult your health care provider.  Hepatitis A vaccine.** / Consult your health care provider.  Hepatitis B vaccine.** / Consult your health care provider.  Haemophilus influenzae type b (Hib) vaccine.** / Consult your health care provider. Ages 65   years and over  Blood pressure check.** / Every 1 to 2 years.  Lipid and cholesterol check.** / Every 5 years beginning at age 22 years.  Lung cancer screening. / Every year if you are aged 73-80 years and have a 30-pack-year history of smoking and currently smoke or have quit within the past 15 years. Yearly screening is stopped once you have quit smoking for at least 15 years or develop a health problem that would prevent you from having lung cancer  treatment.  Clinical breast exam.** / Every year after age 4 years.  BRCA-related cancer risk assessment.** / For women who have family members with a BRCA-related cancer (breast, ovarian, tubal, or peritoneal cancers).  Mammogram.** / Every year beginning at age 40 years and continuing for as long as you are in good health. Consult with your health care provider.  Pap test.** / Every 3 years starting at age 9 years through age 34 or 91 years with 3 consecutive normal Pap tests. Testing can be stopped between 65 and 70 years with 3 consecutive normal Pap tests and no abnormal Pap or HPV tests in the past 10 years.  HPV screening.** / Every 3 years from ages 57 years through ages 64 or 45 years with a history of 3 consecutive normal Pap tests. Testing can be stopped between 65 and 70 years with 3 consecutive normal Pap tests and no abnormal Pap or HPV tests in the past 10 years.  Fecal occult blood test (FOBT) of stool. / Every year beginning at age 15 years and continuing until age 17 years. You may not need to do this test if you get a colonoscopy every 10 years.  Flexible sigmoidoscopy or colonoscopy.** / Every 5 years for a flexible sigmoidoscopy or every 10 years for a colonoscopy beginning at age 86 years and continuing until age 71 years.  Hepatitis C blood test.** / For all people born from [birth year] through 1963 and any individual with known risks for hepatitis C.  Osteoporosis screening.** / A one-time screening for women ages 83 years and over and women at risk for fractures or osteoporosis.  Skin self-exam. / Monthly.  Influenza vaccine. / Every year.  Tetanus, diphtheria, and acellular pertussis (Tdap/Td) vaccine.** / 1 dose of Td every 10 years.  Varicella vaccine.** / Consult your health care provider.  Zoster vaccine.** / 1 dose for adults aged 61 years or older.  Pneumococcal 13-valent conjugate (PCV13) vaccine.** / Consult your health care provider.  Pneumococcal  polysaccharide (PPSV23) vaccine.** / 1 dose for all adults aged 28 years and older.  Meningococcal vaccine.** / Consult your health care provider.  Hepatitis A vaccine.** / Consult your health care provider.  Hepatitis B vaccine.** / Consult your health care provider.  Haemophilus influenzae type b (Hib) vaccine.** / Consult your health care provider. ** Family history and personal history of risk and conditions may change your health care provider's recommendations. Document Released: 08/29/2001 Document Revised: 11/17/2013 Document Reviewed: 11/28/2010 [facility] Patient Information 2015 [place], [place]. This information is not intended to replace advice given to you by your health care provider. Make sure you discuss any questions you have with your health care provider.

## 2015-04-05 NOTE — Progress Notes (Signed)
Pre visit review using our clinic review tool, if applicable. No additional management support is needed unless otherwise documented below in the visit note. 

## 2015-04-06 ENCOUNTER — Encounter: Payer: Self-pay | Admitting: Family Medicine

## 2015-04-07 LAB — CYTOLOGY - PAP

## 2015-04-11 ENCOUNTER — Encounter: Payer: Self-pay | Admitting: Family Medicine

## 2015-04-11 DIAGNOSIS — Z0000 Encounter for general adult medical examination without abnormal findings: Secondary | ICD-10-CM | POA: Insufficient documentation

## 2015-04-11 DIAGNOSIS — K5790 Diverticulosis of intestine, part unspecified, without perforation or abscess without bleeding: Secondary | ICD-10-CM | POA: Insufficient documentation

## 2015-04-11 HISTORY — DX: Diverticulosis of intestine, part unspecified, without perforation or abscess without bleeding: K57.90

## 2015-04-11 HISTORY — DX: Encounter for general adult medical examination without abnormal findings: Z00.00

## 2015-04-11 NOTE — Assessment & Plan Note (Signed)
Encouraged hi fiber diet with adequate hydration

## 2015-04-11 NOTE — Assessment & Plan Note (Signed)
Well controlled, no changes to meds. Encouraged heart healthy diet such as the DASH diet and exercise as tolerated.  °

## 2015-04-11 NOTE — Assessment & Plan Note (Signed)
On Levothyroxine, continue to monitor 

## 2015-04-11 NOTE — Assessment & Plan Note (Signed)
Pap today, no concerns on exam. Pap wnl

## 2015-04-11 NOTE — Assessment & Plan Note (Signed)
Patient encouraged to maintain heart healthy diet, regular exercise, adequate sleep. Consider daily probiotics. Take medications as prescribed. Labs ordered and reviewed. Pap today. Given and reviewed copy of ACP documents from [employer] and encouraged to complete and return

## 2015-04-11 NOTE — Progress Notes (Signed)
Subjective:    Patient ID: Cindy Perkins, female    DOB: 28-Feb-1961, 54 y.o.   MRN: [id number]  Chief Complaint  Patient presents with  . Annual Exam    HPI Patient is in today for annual exam and surveillance of numerous medical conditions. No recent illness. No acute concerns. No further postmenopausal bleeding. Would like to have her Pap smear done here today. No GYN complaints. Denies polyuria or polydipsia. Denies CP/palp/SOB/HA/congestion/fevers/GI or GU c/o. Taking meds as prescribed  Past Medical History  Diagnosis Date  . HYPOTHYROIDISM 03/28/2007  . ALLERGIC RHINITIS 03/28/2007  . Postmenopausal bleeding 05/03/2009  . [place], FEMALE 04/03/2008  . Chicken pox as a child  . Hyperglycemia 03/29/2014  . HYPERTENSION, SYSTOLIC, BORDERLINE 7/82/9562    on medicine for BP  . Allergy   . Cervical cancer screening 04/05/2015  . Preventative health care 04/11/2015  . Diverticulosis 04/11/2015    By colonoscopy in November 2015    Past Surgical History  Procedure Laterality Date  . Tubal ligation  1995  . Knee surgery Left     surgical repair of injury    Family History  Problem Relation Age of Onset  . Dementia Mother   . Hypertension Mother   . Hyperlipidemia Mother   . COPD Mother     smoker but quit for 30 yrs  . Dementia Father   . Diabetes Father     type 2  . Hypertension Father   . Obesity Brother   . Cancer Brother 58    bone  . Heart attack Maternal Grandfather   . Diabetes Paternal Grandfather     type 2  . Obesity Brother   . Pulmonary embolism Brother   . Colon cancer Neg Hx   . Rectal cancer Neg Hx   . Stomach cancer Neg Hx   . COPD Paternal Uncle     Social History   Social History  . Marital Status: Married    Spouse Name: N/A  . Number of Children: N/A  . Years of Education: N/A   Occupational History  . Not on file.   Social History Main Topics  . Smoking status: Never Smoker   . Smokeless tobacco: Never Used  .  Alcohol Use: Yes     Comment: 12 beers weekly  . Drug Use: No  . Sexual Activity: Yes    Birth Control/ Protection: Post-menopausal     Comment: lives with husband, works for [employer], no dietary restrictions   Other Topics Concern  . Not on file   Social History Narrative    Outpatient Prescriptions Prior to Visit  Medication Sig Dispense Refill  . Calcium Carbonate-Vitamin D (CALCIUM-VITAMIN D) 500-200 MG-UNIT per tablet Take 1 tablet by mouth 2 (two) times daily with meals.      Marland Kitchen ibuprofen (ADVIL,MOTRIN) 200 MG tablet Take 400 mg by mouth every 6 (six) hours as needed.    . Multiple Vitamin (MULTIVITAMIN) capsule Take 1 capsule by mouth daily.      Marland Kitchen BENICAR HCT 20-12.5 MG per tablet TAKE 1 TABLET BY MOUTH DAILY. 90 tablet 1  . levothyroxine (SYNTHROID, LEVOTHROID) 100 MCG tablet TAKE 1 TABLET (100 MCG TOTAL) BY MOUTH DAILY BEFORE BREAKFAST. 90 tablet 2   No facility-administered medications prior to visit.    Allergies  Allergen Reactions  . Penicillins Swelling    Review of Systems  Constitutional: Negative for fever, chills and malaise/fatigue.  HENT: Negative for congestion and  hearing loss.   Eyes: Negative for discharge.  Respiratory: Negative for cough, sputum production and shortness of breath.   Cardiovascular: Negative for chest pain, palpitations and leg swelling.  Gastrointestinal: Negative for heartburn, nausea, vomiting, abdominal pain, diarrhea, constipation and blood in stool.  Genitourinary: Negative for dysuria, urgency, frequency and hematuria.  Musculoskeletal: Negative for myalgias, back pain and falls.  Skin: Negative for rash.  Neurological: Negative for dizziness, sensory change, loss of consciousness, weakness and headaches.  Endo/Heme/Allergies: Negative for environmental allergies. Does not bruise/bleed easily.  Psychiatric/Behavioral: Negative for depression and suicidal ideas. The patient is not nervous/anxious and does not have  insomnia.        Objective:    Physical Exam  Constitutional: She is oriented to person, place, and time. She appears well-developed and well-nourished. No distress.  HENT:  Head: Normocephalic and atraumatic.  Eyes: Conjunctivae are normal.  Neck: Neck supple. No thyromegaly present.  Cardiovascular: Normal rate, regular rhythm and normal heart sounds.   No murmur heard. Pulmonary/Chest: Effort normal and breath sounds normal. No respiratory distress.  Abdominal: Soft. Bowel sounds are normal. She exhibits no distension and no mass. There is no tenderness.  Genitourinary: Vagina normal and uterus normal. No vaginal discharge found.  Musculoskeletal: She exhibits no edema.  Lymphadenopathy:    She has no cervical adenopathy.  Neurological: She is alert and oriented to person, place, and time.  Skin: Skin is warm and dry.  Psychiatric: She has a normal mood and affect. Her behavior is normal.    BP 128/80 mmHg  Pulse 57  Temp(Src) 98.1 F (36.7 C) (Oral)  Ht 5\' 5"  (1.651 m)  Wt 172 lb 2 oz (78.075 kg)  BMI 28.64 kg/m2  SpO2 100% Wt Readings from Last 3 Encounters:  04/05/15 172 lb 2 oz (78.075 kg)  10/02/14 173 lb 2 oz (78.529 kg)  06/05/14 182 lb (82.555 kg)     Lab Results  Component Value Date   WBC 5.8 03/29/2015   HGB 13.7 03/29/2015   HCT 40.4 03/29/2015   PLT 181.0 03/29/2015   GLUCOSE 85 03/29/2015   CHOL 180 03/29/2015   TRIG 55.0 03/29/2015   HDL 88.80 03/29/2015   LDLDIRECT 87.7 08/23/2012   LDLCALC 80 03/29/2015   ALT 17 03/29/2015   AST 17 03/29/2015   NA 140 03/29/2015   K 4.4 03/29/2015   CL 101 03/29/2015   CREATININE 0.81 03/29/2015   BUN 16 03/29/2015   CO2 31 03/29/2015   TSH 2.84 03/29/2015   HGBA1C 5.1 03/29/2015    Lab Results  Component Value Date   TSH 2.84 03/29/2015   Lab Results  Component Value Date   WBC 5.8 03/29/2015   HGB 13.7 03/29/2015   HCT 40.4 03/29/2015   MCV 96.3 03/29/2015   PLT 181.0 03/29/2015   Lab  Results  Component Value Date   NA 140 03/29/2015   K 4.4 03/29/2015   CO2 31 03/29/2015   GLUCOSE 85 03/29/2015   BUN 16 03/29/2015   CREATININE 0.81 03/29/2015   BILITOT 0.5 03/29/2015   ALKPHOS 57 03/29/2015   AST 17 03/29/2015   ALT 17 03/29/2015   PROT 7.3 03/29/2015   ALBUMIN 4.1 03/29/2015   CALCIUM 9.3 03/29/2015   GFR 78.74 03/29/2015   Lab Results  Component Value Date   CHOL 180 03/29/2015   Lab Results  Component Value Date   HDL 88.80 03/29/2015   Lab Results  Component Value Date   LDLCALC 80  03/29/2015   Lab Results  Component Value Date   TRIG 55.0 03/29/2015   Lab Results  Component Value Date   CHOLHDL 2 03/29/2015   Lab Results  Component Value Date   HGBA1C 5.1 03/29/2015       Assessment & Plan:   Problem List Items Addressed This Visit    Preventative health care    Patient encouraged to maintain heart healthy diet, regular exercise, adequate sleep. Consider daily probiotics. Take medications as prescribed. Labs ordered and reviewed. Pap today. Given and reviewed copy of ACP documents from [employer] and encouraged to complete and return      Hypothyroidism    On Levothyroxine, continue to monitor      Relevant Medications   levothyroxine (SYNTHROID, LEVOTHROID) 100 MCG tablet   Essential hypertension    Well controlled, no changes to meds. Encouraged heart healthy diet such as the DASH diet and exercise as tolerated.       Relevant Medications   olmesartan-hydrochlorothiazide (BENICAR HCT) 20-12.5 MG per tablet   Diverticulosis    Encouraged hi fiber diet with adequate hydration      Cervical cancer screening - Primary    Pap today, no concerns on exam. Pap wnl      Relevant Orders   Cytology - PAP (Completed)    Other Visit Diagnoses    Sun-damaged skin        Relevant Orders    Ambulatory referral to Dermatology       I have changed Ms. Knutzen's BENICAR HCT to olmesartan-hydrochlorothiazide. I am also  having her maintain her multivitamin, calcium-vitamin D, ibuprofen, and levothyroxine.  Meds ordered this encounter  Medications  . levothyroxine (SYNTHROID, LEVOTHROID) 100 MCG tablet    Sig: TAKE 1 TABLET (100 MCG TOTAL) BY MOUTH DAILY BEFORE BREAKFAST.    Dispense:  90 tablet    Refill:  2  . olmesartan-hydrochlorothiazide (BENICAR HCT) 20-12.5 MG per tablet    Sig: Take 1 tablet by mouth daily.    Dispense:  90 tablet    Refill:  2     [name], MD

## 2015-05-06 ENCOUNTER — Telehealth: Payer: Self-pay | Admitting: Family Medicine

## 2015-05-06 NOTE — Telephone Encounter (Signed)
Pt dropped off a physical results form for wellness program, states she need dr. Charlett Blake to sign for her job, form was placed in your tray.

## 2015-05-10 NOTE — Telephone Encounter (Signed)
Form filled out and forwarded to Dr. Charlett Blake for signature. JG//CMA

## 2015-05-11 NOTE — Telephone Encounter (Signed)
Form faxed and sent for scanning. JG/CMA

## 2015-05-26 ENCOUNTER — Other Ambulatory Visit: Payer: Self-pay

## 2015-05-26 DIAGNOSIS — Z1231 Encounter for screening mammogram for malignant neoplasm of breast: Secondary | ICD-10-CM

## 2015-06-21 ENCOUNTER — Ambulatory Visit
Admission: RE | Admit: 2015-06-21 | Discharge: 2015-06-21 | Disposition: A | Discharge status: Home / Self Care | Payer: Managed Care, Other (non HMO) | Source: Ambulatory Visit

## 2015-06-21 DIAGNOSIS — Z1231 Encounter for screening mammogram for malignant neoplasm of breast: Secondary | ICD-10-CM

## 2015-10-01 ENCOUNTER — Telehealth: Payer: Self-pay | Admitting: *Deleted

## 2015-10-01 ENCOUNTER — Encounter: Payer: Self-pay | Admitting: *Deleted

## 2015-10-01 NOTE — Telephone Encounter (Signed)
Pre-Visit Call completed with patient and chart updated.   Pre-Visit Info documented in Specialty Comments under [employer].    

## 2015-10-04 ENCOUNTER — Encounter: Payer: Self-pay | Admitting: Family Medicine

## 2015-10-04 ENCOUNTER — Ambulatory Visit (HOSPITAL_BASED_OUTPATIENT_CLINIC_OR_DEPARTMENT_OTHER)
Admission: RE | Admit: 2015-10-04 | Discharge: 2015-10-04 | Disposition: A | Discharge status: Home / Self Care | Payer: Managed Care, Other (non HMO) | Source: Ambulatory Visit | Attending: Family Medicine | Admitting: Family Medicine

## 2015-10-04 ENCOUNTER — Ambulatory Visit (INDEPENDENT_AMBULATORY_CARE_PROVIDER_SITE_OTHER): Payer: Managed Care, Other (non HMO) | Admitting: Family Medicine

## 2015-10-04 VITALS — BP 118/72 | HR 55 | Temp 98.0°F | Ht 65.0 in | Wt 183.1 lb

## 2015-10-04 DIAGNOSIS — M25552 Pain in left hip: Secondary | ICD-10-CM | POA: Insufficient documentation

## 2015-10-04 DIAGNOSIS — E039 Hypothyroidism, unspecified: Secondary | ICD-10-CM

## 2015-10-04 DIAGNOSIS — Z0000 Encounter for general adult medical examination without abnormal findings: Secondary | ICD-10-CM

## 2015-10-04 DIAGNOSIS — M79605 Pain in left leg: Secondary | ICD-10-CM | POA: Diagnosis not present

## 2015-10-04 DIAGNOSIS — I10 Essential (primary) hypertension: Secondary | ICD-10-CM | POA: Insufficient documentation

## 2015-10-04 DIAGNOSIS — M533 Sacrococcygeal disorders, not elsewhere classified: Secondary | ICD-10-CM

## 2015-10-04 HISTORY — DX: Sacrococcygeal disorders, not elsewhere classified: M53.3

## 2015-10-04 LAB — COMPREHENSIVE METABOLIC PANEL
ALBUMIN: 4.3 g/dL (ref 3.5–5.2)
ALK PHOS: 55 U/L (ref 39–117)
ALT: 13 U/L (ref 0–35)
AST: 16 U/L (ref 0–37)
BILIRUBIN TOTAL: 0.5 mg/dL (ref 0.2–1.2)
BUN: 15 mg/dL (ref 6–23)
CALCIUM: 9.5 mg/dL (ref 8.4–10.5)
CO2: 32 mEq/L (ref 19–32)
CREATININE: 0.87 mg/dL (ref 0.40–1.20)
Chloride: 101 mEq/L (ref 96–112)
GFR: 72.36 mL/min (ref >60.00)
Glucose, Bld: 96 mg/dL (ref 70–99)
Potassium: 3.9 mEq/L (ref 3.5–5.1)
Sodium: 140 mEq/L (ref 135–145)
Total Protein: 7.3 g/dL (ref 6.0–8.3)

## 2015-10-04 LAB — CBC
HCT: 36.6 % (ref 36.0–46.0)
HEMOGLOBIN: 12.6 g/dL (ref 12.0–15.0)
MCHC: 34.4 g/dL (ref 30.0–36.0)
MCV: 95.4 fl (ref 78.0–100.0)
PLATELETS: 179 10*3/uL (ref 150.0–400.0)
RBC: 3.84 Mil/uL — AB (ref 3.87–5.11)
RDW: 13.7 % (ref 11.5–15.5)
WBC: 5.7 10*3/uL (ref 4.0–10.5)

## 2015-10-04 LAB — TSH: TSH: 0.77 u[IU]/mL (ref 0.35–4.50)

## 2015-10-04 NOTE — Assessment & Plan Note (Signed)
Encouraged moist heat and gentle stretching as tolerated. May try NSAIDs and prescription meds as directed and report if symptoms worsen or seek immediate care. Xray today. Lidocaine patches bid

## 2015-10-04 NOTE — Assessment & Plan Note (Signed)
On Levothyroxine, continue to monitor 

## 2015-10-04 NOTE — Patient Instructions (Addendum)
Salonpas with lidocaine and aspercreme.  Apply patch twice daily as needed. Dr. Leonides Schanz Chiropractor   Preventive Care for Adults, Female A healthy lifestyle and preventive care can promote health and wellness. Preventive health guidelines for women include the following key practices.  A routine yearly physical is a good way to check with your health care provider about your health and preventive screening. It is a chance to share any concerns and updates on your health and to receive a thorough exam.  Visit your dentist for a routine exam and preventive care every 6 months. Brush your teeth twice a day and floss once a day. Good oral hygiene prevents tooth decay and gum disease.  The frequency of eye exams is based on your age, health, family medical history, use of contact lenses, and other factors. Follow your health care provider's recommendations for frequency of eye exams.  Eat a healthy diet. Foods like vegetables, fruits, whole grains, low-fat dairy products, and lean protein foods contain the nutrients you need without too many calories. Decrease your intake of foods high in solid fats, added sugars, and salt. Eat the right amount of calories for you.Get information about a proper diet from your health care provider, if necessary.  Regular physical exercise is one of the most important things you can do for your health. Most adults should get at least 150 minutes of moderate-intensity exercise (any activity that increases your heart rate and causes you to sweat) each week. In addition, most adults need muscle-strengthening exercises on 2 or more days a week.  Maintain a healthy weight. The body mass index (BMI) is a screening tool to identify possible weight problems. It provides an estimate of body fat based on height and weight. Your health care provider can find your BMI and can help you achieve or maintain a healthy weight.For adults 20 years and older:  A BMI below 18.5 is considered  underweight.  A BMI of 18.5 to 24.9 is normal.  A BMI of 25 to 29.9 is considered overweight.  A BMI of 30 and above is considered obese.  Maintain normal blood lipids and cholesterol levels by exercising and minimizing your intake of saturated fat. Eat a balanced diet with plenty of fruit and vegetables. Blood tests for lipids and cholesterol should begin at age 13 and be repeated every 5 years. If your lipid or cholesterol levels are high, you are over 50, or you are at high risk for heart disease, you may need your cholesterol levels checked more frequently.Ongoing high lipid and cholesterol levels should be treated with medicines if diet and exercise are not working.  If you smoke, find out from your health care provider how to quit. If you do not use tobacco, do not start.  Lung cancer screening is recommended for adults aged 66-80 years who are at high risk for developing lung cancer because of a history of smoking. A yearly low-dose CT scan of the lungs is recommended for people who have at least a 30-pack-year history of smoking and are a current smoker or have quit within the past 15 years. A pack year of smoking is smoking an average of 1 pack of cigarettes a day for 1 year (for example: 1 pack a day for 30 years or 2 packs a day for 15 years). Yearly screening should continue until the smoker has stopped smoking for at least 15 years. Yearly screening should be stopped for people who develop a health problem that would prevent them  from having lung cancer treatment.  If you are pregnant, do not drink alcohol. If you are breastfeeding, be very cautious about drinking alcohol. If you are not pregnant and choose to drink alcohol, do not have more than 1 drink per day. One drink is considered to be 12 ounces (355 mL) of beer, 5 ounces (148 mL) of wine, or 1.5 ounces (44 mL) of liquor.  Avoid use of street drugs. Do not share needles with anyone. Ask for help if you need support or  instructions about stopping the use of drugs.  High blood pressure causes heart disease and increases the risk of stroke. Your blood pressure should be checked at least every 1 to 2 years. Ongoing high blood pressure should be treated with medicines if weight loss and exercise do not work.  If you are 55-79 years old, ask your health care provider if you should take aspirin to prevent strokes.  Diabetes screening is done by taking a blood sample to check your blood glucose level after you have not eaten for a certain period of time (fasting). If you are not overweight and you do not have risk factors for diabetes, you should be screened once every 3 years starting at age 45. If you are overweight or obese and you are 40-70 years of age, you should be screened for diabetes every year as part of your cardiovascular risk assessment.  Breast cancer screening is essential preventive care for women. You should practice "breast self-awareness." This means understanding the normal appearance and feel of your breasts and may include breast self-examination. Any changes detected, no matter how small, should be reported to a health care provider. Women in their 20s and 30s should have a clinical breast exam (CBE) by a health care provider as part of a regular health exam every 1 to 3 years. After age 40, women should have a CBE every year. Starting at age 40, women should consider having a mammogram (breast X-ray test) every year. Women who have a family history of breast cancer should talk to their health care provider about genetic screening. Women at a high risk of breast cancer should talk to their health care providers about having an MRI and a mammogram every year.  Breast cancer gene (BRCA)-related cancer risk assessment is recommended for women who have family members with BRCA-related cancers. BRCA-related cancers include breast, ovarian, tubal, and peritoneal cancers. Having family members with these  cancers may be associated with an increased risk for harmful changes (mutations) in the breast cancer genes BRCA1 and BRCA2. Results of the assessment will determine the need for genetic counseling and BRCA1 and BRCA2 testing.  Your health care provider may recommend that you be screened regularly for cancer of the pelvic organs (ovaries, uterus, and vagina). This screening involves a pelvic examination, including checking for microscopic changes to the surface of your cervix (Pap test). You may be encouraged to have this screening done every 3 years, beginning at age 21.  For women ages 30-65, health care providers may recommend pelvic exams and Pap testing every 3 years, or they may recommend the Pap and pelvic exam, combined with testing for human papilloma virus (HPV), every 5 years. Some types of HPV increase your risk of cervical cancer. Testing for HPV may also be done on women of any age with unclear Pap test results.  Other health care providers may not recommend any screening for nonpregnant women who are considered low risk for pelvic cancer and who   do not have symptoms. Ask your health care provider if a screening pelvic exam is right for you.  If you have had past treatment for cervical cancer or a condition that could lead to cancer, you need Pap tests and screening for cancer for at least 20 years after your treatment. If Pap tests have been discontinued, your risk factors (such as having a new sexual partner) need to be reassessed to determine if screening should resume. Some women have medical problems that increase the chance of getting cervical cancer. In these cases, your health care provider may recommend more frequent screening and Pap tests.  Colorectal cancer can be detected and often prevented. Most routine colorectal cancer screening begins at the age of 50 years and continues through age 75 years. However, your health care provider may recommend screening at an earlier age if you  have risk factors for colon cancer. On a yearly basis, your health care provider may provide home test kits to check for hidden blood in the stool. Use of a small camera at the end of a tube, to directly examine the colon (sigmoidoscopy or colonoscopy), can detect the earliest forms of colorectal cancer. Talk to your health care provider about this at age 50, when routine screening begins. Direct exam of the colon should be repeated every 5-10 years through age 75 years, unless early forms of precancerous polyps or small growths are found.  People who are at an increased risk for hepatitis B should be screened for this virus. You are considered at high risk for hepatitis B if:  You were born in a country where hepatitis B occurs often. Talk with your health care provider about which countries are considered high risk.  Your parents were born in a high-risk country and you have not received a shot to protect against hepatitis B (hepatitis B vaccine).  You have HIV or AIDS.  You use needles to inject street drugs.  You live with, or have sex with, someone who has hepatitis B.  You get hemodialysis treatment.  You take certain medicines for conditions like cancer, organ transplantation, and autoimmune conditions.  Hepatitis C blood testing is recommended for all people born from 1945 through 1965 and any individual with known risks for hepatitis C.  Practice safe sex. Use condoms and avoid high-risk sexual practices to reduce the spread of sexually transmitted infections (STIs). STIs include gonorrhea, chlamydia, syphilis, trichomonas, herpes, HPV, and human immunodeficiency virus (HIV). Herpes, HIV, and HPV are viral illnesses that have no cure. They can result in disability, cancer, and death.  You should be screened for sexually transmitted illnesses (STIs) including gonorrhea and chlamydia if:  You are sexually active and are younger than 24 years.  You are older than 24 years and your  health care provider tells you that you are at risk for this type of infection.  Your sexual activity has changed since you were last screened and you are at an increased risk for chlamydia or gonorrhea. Ask your health care provider if you are at risk.  If you are at risk of being infected with HIV, it is recommended that you take a prescription medicine daily to prevent HIV infection. This is called preexposure prophylaxis (PrEP). You are considered at risk if:  You are sexually active and do not regularly use condoms or know the HIV status of your partner(s).  You take drugs by injection.  You are sexually active with a partner who has HIV.  Talk with   your health care provider about whether you are at high risk of being infected with HIV. If you choose to begin PrEP, you should first be tested for HIV. You should then be tested every 3 months for as long as you are taking PrEP.  Osteoporosis is a disease in which the bones lose minerals and strength with aging. This can result in serious bone fractures or breaks. The risk of osteoporosis can be identified using a bone density scan. Women ages 87 years and over and women at risk for fractures or osteoporosis should discuss screening with their health care providers. Ask your health care provider whether you should take a calcium supplement or vitamin D to reduce the rate of osteoporosis.  Menopause can be associated with physical symptoms and risks. Hormone replacement therapy is available to decrease symptoms and risks. You should talk to your health care provider about whether hormone replacement therapy is right for you.  Use sunscreen. Apply sunscreen liberally and repeatedly throughout the day. You should seek shade when your shadow is shorter than you. Protect yourself by wearing long sleeves, pants, a wide-brimmed hat, and sunglasses year round, whenever you are outdoors.  Once a month, do a whole body skin exam, using a mirror to look  at the skin on your back. Tell your health care provider of new moles, moles that have irregular borders, moles that are larger than a pencil eraser, or moles that have changed in shape or color.  Stay current with required vaccines (immunizations).  Influenza vaccine. All adults should be immunized every year.  Tetanus, diphtheria, and acellular pertussis (Td, Tdap) vaccine. Pregnant women should receive 1 dose of Tdap vaccine during each pregnancy. The dose should be obtained regardless of the length of time since the last dose. Immunization is preferred during the 27th-36th week of gestation. An adult who has not previously received Tdap or who does not know her vaccine status should receive 1 dose of Tdap. This initial dose should be followed by tetanus and diphtheria toxoids (Td) booster doses every 10 years. Adults with an unknown or incomplete history of completing a 3-dose immunization series with Td-containing vaccines should begin or complete a primary immunization series including a Tdap dose. Adults should receive a Td booster every 10 years.  Varicella vaccine. An adult without evidence of immunity to varicella should receive 2 doses or a second dose if she has previously received 1 dose. Pregnant females who do not have evidence of immunity should receive the first dose after pregnancy. This first dose should be obtained before leaving the health care facility. The second dose should be obtained 4-8 weeks after the first dose.  Human papillomavirus (HPV) vaccine. Females aged 13-26 years who have not received the vaccine previously should obtain the 3-dose series. The vaccine is not recommended for use in pregnant females. However, pregnancy testing is not needed before receiving a dose. If a female is found to be pregnant after receiving a dose, no treatment is needed. In that case, the remaining doses should be delayed until after the pregnancy. Immunization is recommended for any person  with an immunocompromised condition through the age of 24 years if she did not get any or all doses earlier. During the 3-dose series, the second dose should be obtained 4-8 weeks after the first dose. The third dose should be obtained 24 weeks after the first dose and 16 weeks after the second dose.  Zoster vaccine. One dose is recommended for adults aged 45  years or older unless certain conditions are present.  Measles, mumps, and rubella (MMR) vaccine. Adults born before 1957 generally are considered immune to measles and mumps. Adults born in 1957 or later should have 1 or more doses of MMR vaccine unless there is a contraindication to the vaccine or there is laboratory evidence of immunity to each of the three diseases. A routine second dose of MMR vaccine should be obtained at least 28 days after the first dose for students attending postsecondary schools, health care workers, or international travelers. People who received inactivated measles vaccine or an unknown type of measles vaccine during 1963-1967 should receive 2 doses of MMR vaccine. People who received inactivated mumps vaccine or an unknown type of mumps vaccine before 1979 and are at high risk for mumps infection should consider immunization with 2 doses of MMR vaccine. For females of childbearing age, rubella immunity should be determined. If there is no evidence of immunity, females who are not pregnant should be vaccinated. If there is no evidence of immunity, females who are pregnant should delay immunization until after pregnancy. Unvaccinated health care workers born before 1957 who lack laboratory evidence of measles, mumps, or rubella immunity or laboratory confirmation of disease should consider measles and mumps immunization with 2 doses of MMR vaccine or rubella immunization with 1 dose of MMR vaccine.  Pneumococcal 13-valent conjugate (PCV13) vaccine. When indicated, a person who is uncertain of his immunization history and has  no record of immunization should receive the PCV13 vaccine. All adults 65 years of age and older should receive this vaccine. An adult aged 19 years or older who has certain medical conditions and has not been previously immunized should receive 1 dose of PCV13 vaccine. This PCV13 should be followed with a dose of pneumococcal polysaccharide (PPSV23) vaccine. Adults who are at high risk for pneumococcal disease should obtain the PPSV23 vaccine at least 8 weeks after the dose of PCV13 vaccine. Adults older than 53 years of age who have normal immune system function should obtain the PPSV23 vaccine dose at least 1 year after the dose of PCV13 vaccine.  Pneumococcal polysaccharide (PPSV23) vaccine. When PCV13 is also indicated, PCV13 should be obtained first. All adults aged 65 years and older should be immunized. An adult younger than age 65 years who has certain medical conditions should be immunized. Any person who resides in a nursing home or long-term care facility should be immunized. An adult smoker should be immunized. People with an immunocompromised condition and certain other conditions should receive both PCV13 and PPSV23 vaccines. People with human immunodeficiency virus (HIV) infection should be immunized as soon as possible after diagnosis. Immunization during chemotherapy or radiation therapy should be avoided. Routine use of PPSV23 vaccine is not recommended for American Indians, [place] Natives, or people younger than 65 years unless there are medical conditions that require PPSV23 vaccine. When indicated, people who have unknown immunization and have no record of immunization should receive PPSV23 vaccine. One-time revaccination 5 years after the first dose of PPSV23 is recommended for people aged 19-64 years who have chronic kidney failure, nephrotic syndrome, asplenia, or immunocompromised conditions. People who received 1-2 doses of PPSV23 before age 65 years should receive another dose of PPSV23  vaccine at age 65 years or later if at least 5 years have passed since the previous dose. Doses of PPSV23 are not needed for people immunized with PPSV23 at or after age 65 years.  Meningococcal vaccine. Adults with asplenia or persistent complement   component deficiencies should receive 2 doses of quadrivalent meningococcal conjugate (MenACWY-D) vaccine. The doses should be obtained at least 2 months apart. Microbiologists working with certain meningococcal bacteria, military recruits, people at risk during an outbreak, and people who travel to or live in countries with a high rate of meningitis should be immunized. A first-year college student up through age 21 years who is living in a residence hall should receive a dose if she did not receive a dose on or after her 16th birthday. Adults who have certain high-risk conditions should receive one or more doses of vaccine.  Hepatitis A vaccine. Adults who wish to be protected from this disease, have certain high-risk conditions, work with hepatitis A-infected animals, work in hepatitis A research labs, or travel to or work in countries with a high rate of hepatitis A should be immunized. Adults who were previously unvaccinated and who anticipate close contact with an international adoptee during the first 60 days after arrival in the [place] from a country with a high rate of hepatitis A should be immunized.  Hepatitis B vaccine. Adults who wish to be protected from this disease, have certain high-risk conditions, may be exposed to blood or other infectious body fluids, are household contacts or sex partners of hepatitis B positive people, are clients or workers in certain care facilities, or travel to or work in countries with a high rate of hepatitis B should be immunized.  Haemophilus influenzae type b (Hib) vaccine. A previously unvaccinated person with asplenia or sickle cell disease or having a scheduled splenectomy should receive 1 dose of Hib  vaccine. Regardless of previous immunization, a recipient of a hematopoietic stem cell transplant should receive a 3-dose series 6-12 months after her successful transplant. Hib vaccine is not recommended for adults with HIV infection. Preventive Services / Frequency Ages 19 to 39 years  Blood pressure check.** / Every 3-5 years.  Lipid and cholesterol check.** / Every 5 years beginning at age 20.  Clinical breast exam.** / Every 3 years for women in their 20s and 30s.  BRCA-related cancer risk assessment.** / For women who have family members with a BRCA-related cancer (breast, ovarian, tubal, or peritoneal cancers).  Pap test.** / Every 2 years from ages 21 through 29. Every 3 years starting at age 30 through age 65 or 70 with a history of 3 consecutive normal Pap tests.  HPV screening.** / Every 3 years from ages 30 through ages 65 to 70 with a history of 3 consecutive normal Pap tests.  Hepatitis C blood test.** / For any individual with known risks for hepatitis C.  Skin self-exam. / Monthly.  Influenza vaccine. / Every year.  Tetanus, diphtheria, and acellular pertussis (Tdap, Td) vaccine.** / Consult your health care provider. Pregnant women should receive 1 dose of Tdap vaccine during each pregnancy. 1 dose of Td every 10 years.  Varicella vaccine.** / Consult your health care provider. Pregnant females who do not have evidence of immunity should receive the first dose after pregnancy.  HPV vaccine. / 3 doses over 6 months, if 26 and younger. The vaccine is not recommended for use in pregnant females. However, pregnancy testing is not needed before receiving a dose.  Measles, mumps, rubella (MMR) vaccine.** / You need at least 1 dose of MMR if you were born in 1957 or later. You may also need a 2nd dose. For females of childbearing age, rubella immunity should be determined. If there is no evidence of immunity, females   who are not pregnant should be vaccinated. If there is no  evidence of immunity, females who are pregnant should delay immunization until after pregnancy.  Pneumococcal 13-valent conjugate (PCV13) vaccine.** / Consult your health care provider.  Pneumococcal polysaccharide (PPSV23) vaccine.** / 1 to 2 doses if you smoke cigarettes or if you have certain conditions.  Meningococcal vaccine.** / 1 dose if you are age 19 to 21 years and a first-year college student living in a residence hall, or have one of several medical conditions, you need to get vaccinated against meningococcal disease. You may also need additional booster doses.  Hepatitis A vaccine.** / Consult your health care provider.  Hepatitis B vaccine.** / Consult your health care provider.  Haemophilus influenzae type b (Hib) vaccine.** / Consult your health care provider. Ages 40 to 64 years  Blood pressure check.** / Every year.  Lipid and cholesterol check.** / Every 5 years beginning at age 20 years.  Lung cancer screening. / Every year if you are aged 55-80 years and have a 30-pack-year history of smoking and currently smoke or have quit within the past 15 years. Yearly screening is stopped once you have quit smoking for at least 15 years or develop a health problem that would prevent you from having lung cancer treatment.  Clinical breast exam.** / Every year after age 40 years.  BRCA-related cancer risk assessment.** / For women who have family members with a BRCA-related cancer (breast, ovarian, tubal, or peritoneal cancers).  Mammogram.** / Every year beginning at age 40 years and continuing for as long as you are in good health. Consult with your health care provider.  Pap test.** / Every 3 years starting at age 30 years through age 65 or 70 years with a history of 3 consecutive normal Pap tests.  HPV screening.** / Every 3 years from ages 30 years through ages 65 to 70 years with a history of 3 consecutive normal Pap tests.  Fecal occult blood test (FOBT) of stool. /  Every year beginning at age 50 years and continuing until age 75 years. You may not need to do this test if you get a colonoscopy every 10 years.  Flexible sigmoidoscopy or colonoscopy.** / Every 5 years for a flexible sigmoidoscopy or every 10 years for a colonoscopy beginning at age 50 years and continuing until age 75 years.  Hepatitis C blood test.** / For all people born from 1945 through 1965 and any individual with known risks for hepatitis C.  Skin self-exam. / Monthly.  Influenza vaccine. / Every year.  Tetanus, diphtheria, and acellular pertussis (Tdap/Td) vaccine.** / Consult your health care provider. Pregnant women should receive 1 dose of Tdap vaccine during each pregnancy. 1 dose of Td every 10 years.  Varicella vaccine.** / Consult your health care provider. Pregnant females who do not have evidence of immunity should receive the first dose after pregnancy.  Zoster vaccine.** / 1 dose for adults aged 60 years or older.  Measles, mumps, rubella (MMR) vaccine.** / You need at least 1 dose of MMR if you were born in 1957 or later. You may also need a second dose. For females of childbearing age, rubella immunity should be determined. If there is no evidence of immunity, females who are not pregnant should be vaccinated. If there is no evidence of immunity, females who are pregnant should delay immunization until after pregnancy.  Pneumococcal 13-valent conjugate (PCV13) vaccine.** / Consult your health care provider.  Pneumococcal polysaccharide (PPSV23) vaccine.** / 1   to 2 doses if you smoke cigarettes or if you have certain conditions.  Meningococcal vaccine.** / Consult your health care provider.  Hepatitis A vaccine.** / Consult your health care provider.  Hepatitis B vaccine.** / Consult your health care provider.  Haemophilus influenzae type b (Hib) vaccine.** / Consult your health care provider. Ages 33 years and over  Blood pressure check.** / Every year.  Lipid  and cholesterol check.** / Every 5 years beginning at age 25 years.  Lung cancer screening. / Every year if you are aged 67-80 years and have a 30-pack-year history of smoking and currently smoke or have quit within the past 15 years. Yearly screening is stopped once you have quit smoking for at least 15 years or develop a health problem that would prevent you from having lung cancer treatment.  Clinical breast exam.** / Every year after age 39 years.  BRCA-related cancer risk assessment.** / For women who have family members with a BRCA-related cancer (breast, ovarian, tubal, or peritoneal cancers).  Mammogram.** / Every year beginning at age 56 years and continuing for as long as you are in good health. Consult with your health care provider.  Pap test.** / Every 3 years starting at age 22 years through age 74 or 48 years with 3 consecutive normal Pap tests. Testing can be stopped between 65 and 70 years with 3 consecutive normal Pap tests and no abnormal Pap or HPV tests in the past 10 years.  HPV screening.** / Every 3 years from ages 69 years through ages 76 or 69 years with a history of 3 consecutive normal Pap tests. Testing can be stopped between 65 and 70 years with 3 consecutive normal Pap tests and no abnormal Pap or HPV tests in the past 10 years.  Fecal occult blood test (FOBT) of stool. / Every year beginning at age 43 years and continuing until age 59 years. You may not need to do this test if you get a colonoscopy every 10 years.  Flexible sigmoidoscopy or colonoscopy.** / Every 5 years for a flexible sigmoidoscopy or every 10 years for a colonoscopy beginning at age 78 years and continuing until age 88 years.  Hepatitis C blood test.** / For all people born from [birth year] through 1965 and any individual with known risks for hepatitis C.  Osteoporosis screening.** / A one-time screening for women ages 71 years and over and women at risk for fractures or osteoporosis.  Skin  self-exam. / Monthly.  Influenza vaccine. / Every year.  Tetanus, diphtheria, and acellular pertussis (Tdap/Td) vaccine.** / 1 dose of Td every 10 years.  Varicella vaccine.** / Consult your health care provider.  Zoster vaccine.** / 1 dose for adults aged 62 years or older.  Pneumococcal 13-valent conjugate (PCV13) vaccine.** / Consult your health care provider.  Pneumococcal polysaccharide (PPSV23) vaccine.** / 1 dose for all adults aged 32 years and older.  Meningococcal vaccine.** / Consult your health care provider.  Hepatitis A vaccine.** / Consult your health care provider.  Hepatitis B vaccine.** / Consult your health care provider.  Haemophilus influenzae type b (Hib) vaccine.** / Consult your health care provider. ** Family history and personal history of risk and conditions may change your health care provider's recommendations.   This information is not intended to replace advice given to you by your health care provider. Make sure you discuss any questions you have with your health care provider.   Document Released: 08/29/2001 Document Revised: 07/24/2014 Document Reviewed: 11/28/2010 Elsevier Interactive  Patient Education 2016 [employer].

## 2015-10-04 NOTE — Assessment & Plan Note (Signed)
Patient encouraged to maintain heart healthy diet, regular exercise, adequate sleep. Consider daily probiotics. Take medications as prescribed. Has ACP documents encouraged to bring a copy

## 2015-10-04 NOTE — Progress Notes (Signed)
Pre visit review using our clinic review tool, if applicable. No additional management support is needed unless otherwise documented below in the visit note. 

## 2015-10-04 NOTE — Assessment & Plan Note (Signed)
Well controlled, no changes to meds. Encouraged heart healthy diet such as the DASH diet and exercise as tolerated.  °

## 2015-10-04 NOTE — Assessment & Plan Note (Signed)
Encouraged DASH diet, decrease po intake and increase exercise as tolerated. Needs 7-8 hours of sleep nightly. Avoid trans fats, eat small, frequent meals every 4-5 hours with lean proteins, complex carbs and healthy fats. Minimize simple carbs 

## 2015-10-04 NOTE — Progress Notes (Signed)
Subjective:    Patient ID: Cindy Perkins, female    DOB: [birth year], 55 y.o.   MRN: [id number]  Chief Complaint  Patient presents with  . Annual Exam    HPI Patient is in today for Annual Physicial Exam.   Patient having some concerns with some radiating pain that shoots up the left leg to the buttocks recently has been exercising more, has been going on for a few weeks. Patient is taking otc ibuprofen and gets some relief.   She endorses she has been at the gym several days a week but does not necessarily flare at the gym. No trauma or falls. No other acute complaints. Denies CP/palp/SOB/HA/congestion/fevers/GI or GU c/o. Taking meds as prescribed    Past Medical History  Diagnosis Date  . HYPOTHYROIDISM 03/28/2007  . ALLERGIC RHINITIS 03/28/2007  . Postmenopausal bleeding 05/03/2009  . [place], FEMALE 04/03/2008  . Chicken pox as a child  . Hyperglycemia 03/29/2014  . HYPERTENSION, SYSTOLIC, BORDERLINE [id number]    on medicine for BP  . Allergy   . Cervical cancer screening 04/05/2015  . Preventative health care 04/11/2015  . Diverticulosis 04/11/2015    By colonoscopy in November 2015  . Sacroiliac dysfunction 10/04/2015    Past Surgical History  Procedure Laterality Date  . Tubal ligation  1995  . Knee surgery Left     surgical repair of injury    Family History  Problem Relation Age of Onset  . Dementia Mother   . Hypertension Mother   . Hyperlipidemia Mother   . COPD Mother     smoker but quit for 30 yrs  . Dementia Father   . Diabetes Father     type 2  . Hypertension Father   . Obesity Brother   . Cancer Brother 58    bone  . Heart attack Maternal Grandfather   . Diabetes Paternal Grandfather     type 2  . Obesity Brother   . Pulmonary embolism Brother   . Colon cancer Neg Hx   . Rectal cancer Neg Hx   . Stomach cancer Neg Hx   . COPD Paternal Uncle     Social History   Social History  . Marital Status: Married    Spouse Name: N/A  .  Number of Children: N/A  . Years of Education: N/A   Occupational History  . Not on file.   Social History Main Topics  . Smoking status: Never Smoker   . Smokeless tobacco: Never Used  . Alcohol Use: Yes     Comment: 12 beers weekly  . Drug Use: No  . Sexual Activity: Yes    Birth Control/ Protection: Post-menopausal     Comment: lives with husband, works for [employer], no dietary restrictions   Other Topics Concern  . Not on file   Social History Narrative    Outpatient Prescriptions Prior to Visit  Medication Sig Dispense Refill  . Calcium Carbonate-Vitamin D (CALCIUM-VITAMIN D) 500-200 MG-UNIT per tablet Take 1 tablet by mouth 2 (two) times daily with a meal. Reported on 10/01/2015    . ibuprofen (ADVIL,MOTRIN) 200 MG tablet Take 400 mg by mouth every 6 (six) hours as needed.    Marland Kitchen levothyroxine (SYNTHROID, LEVOTHROID) 100 MCG tablet TAKE 1 TABLET (100 MCG TOTAL) BY MOUTH DAILY BEFORE BREAKFAST. 90 tablet 2  . Multiple Vitamin (MULTIVITAMIN) capsule Take 1 capsule by mouth daily.      Marland Kitchen olmesartan-hydrochlorothiazide (BENICAR HCT) 20-12.5 MG per tablet  Take 1 tablet by mouth daily. 90 tablet 2   No facility-administered medications prior to visit.    Allergies  Allergen Reactions  . Penicillins Swelling    Review of Systems  Constitutional: Negative for fever and malaise/fatigue.  HENT: Negative for congestion.   Eyes: Negative for blurred vision.  Respiratory: Negative for shortness of breath.   Cardiovascular: Negative for chest pain, palpitations and leg swelling.  Gastrointestinal: Negative for nausea, abdominal pain and blood in stool.  Genitourinary: Negative for dysuria and frequency.  Musculoskeletal: Positive for joint pain. Negative for falls.  Skin: Negative for rash.  Neurological: Negative for dizziness, loss of consciousness and headaches.  Endo/Heme/Allergies: Negative for environmental allergies.  Psychiatric/Behavioral: Negative for  depression. The patient is not nervous/anxious.        Objective:    Physical Exam  Constitutional: She is oriented to person, place, and time. She appears well-developed and well-nourished. No distress.  HENT:  Head: Normocephalic and atraumatic.  Eyes: Conjunctivae are normal.  Neck: Neck supple. No thyromegaly present.  Cardiovascular: Normal rate, regular rhythm and normal heart sounds.   No murmur heard. Pulmonary/Chest: Effort normal and breath sounds normal. No respiratory distress.  Abdominal: Soft. Bowel sounds are normal. She exhibits no distension and no mass. There is no tenderness.  Musculoskeletal: She exhibits tenderness. She exhibits no edema.  tener with palp over left posterior hip  Lymphadenopathy:    She has no cervical adenopathy.  Neurological: She is alert and oriented to person, place, and time.  Skin: Skin is warm and dry.  Psychiatric: She has a normal mood and affect. Her behavior is normal.    BP 118/72 mmHg  Pulse 55  Temp(Src) 98 F (36.7 C) (Oral)  Ht 5\' 5"  (1.651 m)  Wt 183 lb 2 oz (83.065 kg)  BMI 30.47 kg/m2  SpO2 98% Wt Readings from Last 3 Encounters:  10/04/15 183 lb 2 oz (83.065 kg)  04/05/15 172 lb 2 oz (78.075 kg)  10/02/14 173 lb 2 oz (78.529 kg)     Lab Results  Component Value Date   WBC 5.8 03/29/2015   HGB 13.7 03/29/2015   HCT 40.4 03/29/2015   PLT 181.0 03/29/2015   GLUCOSE 85 03/29/2015   CHOL 180 03/29/2015   TRIG 55.0 03/29/2015   HDL 88.80 03/29/2015   LDLDIRECT 87.7 08/23/2012   LDLCALC 80 03/29/2015   ALT 17 03/29/2015   AST 17 03/29/2015   NA 140 03/29/2015   K 4.4 03/29/2015   CL 101 03/29/2015   CREATININE 0.81 03/29/2015   BUN 16 03/29/2015   CO2 31 03/29/2015   TSH 2.84 03/29/2015   HGBA1C 5.1 03/29/2015    Lab Results  Component Value Date   TSH 2.84 03/29/2015   Lab Results  Component Value Date   WBC 5.8 03/29/2015   HGB 13.7 03/29/2015   HCT 40.4 03/29/2015   MCV 96.3 03/29/2015    PLT 181.0 03/29/2015   Lab Results  Component Value Date   NA 140 03/29/2015   K 4.4 03/29/2015   CO2 31 03/29/2015   GLUCOSE 85 03/29/2015   BUN 16 03/29/2015   CREATININE 0.81 03/29/2015   BILITOT 0.5 03/29/2015   ALKPHOS 57 03/29/2015   AST 17 03/29/2015   ALT 17 03/29/2015   PROT 7.3 03/29/2015   ALBUMIN 4.1 03/29/2015   CALCIUM 9.3 03/29/2015   GFR 78.74 03/29/2015   Lab Results  Component Value Date   CHOL 180 03/29/2015   Lab  Results  Component Value Date   HDL 88.80 03/29/2015   Lab Results  Component Value Date   LDLCALC 80 03/29/2015   Lab Results  Component Value Date   TRIG 55.0 03/29/2015   Lab Results  Component Value Date   CHOLHDL 2 03/29/2015   Lab Results  Component Value Date   HGBA1C 5.1 03/29/2015       Assessment & Plan:   Problem List Items Addressed This Visit    Essential hypertension    Well controlled, no changes to meds. Encouraged heart healthy diet such as the DASH diet and exercise as tolerated.       Relevant Orders   Tdap vaccine greater than or equal to 7yo IM   TSH   CBC   Comprehensive metabolic panel   DG HIP UNILAT WITH PELVIS 2-3 VIEWS LEFT   Hypothyroidism    On Levothyroxine, continue to monitor      Relevant Orders   Tdap vaccine greater than or equal to 7yo IM   TSH   CBC   Comprehensive metabolic panel   DG HIP UNILAT WITH PELVIS 2-3 VIEWS LEFT   Preventative health care    Patient encouraged to maintain heart healthy diet, regular exercise, adequate sleep. Consider daily probiotics. Take medications as prescribed. Has ACP documents encouraged to bring a copy      Sacroiliac dysfunction - Primary    Encouraged moist heat and gentle stretching as tolerated. May try NSAIDs and prescription meds as directed and report if symptoms worsen or seek immediate care. Xray today. Lidocaine patches bid         I am having Ms. Maggard maintain her multivitamin, calcium-vitamin D, ibuprofen, levothyroxine, and  olmesartan-hydrochlorothiazide.  No orders of the defined types were placed in this encounter.     [name], MD

## 2016-03-08 ENCOUNTER — Other Ambulatory Visit: Payer: Self-pay | Admitting: Family Medicine

## 2016-04-06 ENCOUNTER — Ambulatory Visit (INDEPENDENT_AMBULATORY_CARE_PROVIDER_SITE_OTHER): Payer: Managed Care, Other (non HMO) | Admitting: Family Medicine

## 2016-04-06 ENCOUNTER — Encounter: Payer: Self-pay | Admitting: Family Medicine

## 2016-04-06 VITALS — BP 118/80 | HR 62 | Temp 97.8°F | Ht 65.0 in | Wt 179.0 lb

## 2016-04-06 DIAGNOSIS — Z0000 Encounter for general adult medical examination without abnormal findings: Secondary | ICD-10-CM | POA: Diagnosis not present

## 2016-04-06 DIAGNOSIS — I10 Essential (primary) hypertension: Secondary | ICD-10-CM | POA: Diagnosis not present

## 2016-04-06 DIAGNOSIS — R739 Hyperglycemia, unspecified: Secondary | ICD-10-CM | POA: Diagnosis not present

## 2016-04-06 DIAGNOSIS — E039 Hypothyroidism, unspecified: Secondary | ICD-10-CM | POA: Diagnosis not present

## 2016-04-06 MED ORDER — OLMESARTAN MEDOXOMIL-HCTZ 20-12.5 MG PO TABS: 1.0000 | ORAL_TABLET | Freq: Every day | ORAL | 2 refills | Status: DC

## 2016-04-06 NOTE — Progress Notes (Signed)
Pre visit review using our clinic review tool, if applicable. No additional management support is needed unless otherwise documented below in the visit note. 

## 2016-04-06 NOTE — Patient Instructions (Signed)

## 2016-04-15 NOTE — Assessment & Plan Note (Signed)
minimize simple carbs. Increase exercise as tolerated.  

## 2016-04-15 NOTE — Assessment & Plan Note (Signed)
On Levothyroxine, continue to monitor 

## 2016-04-15 NOTE — Progress Notes (Signed)
Patient ID: Cindy Perkins, female   DOB: [birth year], 55 y.o.   MRN: [id number]   Subjective:    Patient ID: Cindy Perkins, female    DOB: Oct 25, 1960, 55 y.o.   MRN: [id number]  Chief Complaint  Patient presents with  . Follow-up    HPI Patient is in today for follow up. Overall she feels well but she did have a sense of chest tightness this am she believes was stress related. No recent similar episodes and no associated symptoms. No recent illness or acute concerns otherwise. Denies palp/SOB/HA/congestion/fevers/GI or GU c/o. Taking meds as prescribed  Past Medical History:  Diagnosis Date  . ALLERGIC RHINITIS 03/28/2007  . Allergy   . Cervical cancer screening 04/05/2015  . Chicken pox as a child  . [place], FEMALE 04/03/2008  . Diverticulosis 04/11/2015   By colonoscopy in November 2015  . Hyperglycemia 03/29/2014  . HYPERTENSION, SYSTOLIC, BORDERLINE 4/50/3888   on medicine for BP  . HYPOTHYROIDISM 03/28/2007  . Postmenopausal bleeding 05/03/2009  . Preventative health care 04/11/2015  . Sacroiliac dysfunction 10/04/2015    Past Surgical History:  Procedure Laterality Date  . KNEE SURGERY Left    surgical repair of injury  . TUBAL LIGATION  1995    Family History  Problem Relation Age of Onset  . Dementia Mother   . Hypertension Mother   . Hyperlipidemia Mother   . COPD Mother     smoker but quit for 30 yrs  . Dementia Father   . Diabetes Father     type 2  . Hypertension Father   . Obesity Brother   . Cancer Brother 58    bone  . Heart attack Maternal Grandfather   . Diabetes Paternal Grandfather     type 2  . Obesity Brother   . Pulmonary embolism Brother   . Colon cancer Neg Hx   . Rectal cancer Neg Hx   . Stomach cancer Neg Hx   . COPD Paternal Uncle     Social History   Social History  . Marital status: Married    Spouse name: N/A  . Number of children: N/A  . Years of education: N/A   Occupational History  . Not on file.   Social  History Main Topics  . Smoking status: Never Smoker  . Smokeless tobacco: Never Used  . Alcohol use Yes     Comment: 12 beers weekly  . Drug use: No  . Sexual activity: Yes    Birth control/ protection: Post-menopausal     Comment: lives with husband, works for [employer], no dietary restrictions   Other Topics Concern  . Not on file   Social History Narrative  . No narrative on file    Outpatient Medications Prior to Visit  Medication Sig Dispense Refill  . Calcium Carbonate-Vitamin D (CALCIUM-VITAMIN D) 500-200 MG-UNIT per tablet Take 1 tablet by mouth 2 (two) times daily with a meal. Reported on 10/01/2015    . ibuprofen (ADVIL,MOTRIN) 200 MG tablet Take 400 mg by mouth every 6 (six) hours as needed.    Marland Kitchen levothyroxine (SYNTHROID, LEVOTHROID) 100 MCG tablet TAKE 1 TABLET (100 MCG TOTAL) BY MOUTH DAILY BEFORE BREAKFAST. 90 tablet 2  . Multiple Vitamin (MULTIVITAMIN) capsule Take 1 capsule by mouth daily.      Marland Kitchen olmesartan-hydrochlorothiazide (BENICAR HCT) 20-12.5 MG tablet TAKE 1 TABLET BY MOUTH DAILY. 90 tablet 0   No facility-administered medications prior to visit.     Allergies  Allergen Reactions  . Penicillins Swelling    Review of Systems  Constitutional: Negative for fever and malaise/fatigue.  HENT: Negative for congestion.   Eyes: Negative for blurred vision.  Respiratory: Negative for shortness of breath.   Cardiovascular: Negative for chest pain, palpitations and leg swelling.  Gastrointestinal: Negative for abdominal pain, blood in stool and nausea.  Genitourinary: Negative for dysuria and frequency.  Musculoskeletal: Negative for falls.  Skin: Negative for rash.  Neurological: Negative for dizziness, loss of consciousness and headaches.  Endo/Heme/Allergies: Negative for environmental allergies.  Psychiatric/Behavioral: Negative for depression. The patient is not nervous/anxious.        Objective:    Physical Exam  Constitutional: She is  oriented to person, place, and time. She appears well-developed and well-nourished. No distress.  HENT:  Head: Normocephalic and atraumatic.  Nose: Nose normal.  Eyes: Right eye exhibits no discharge. Left eye exhibits no discharge.  Neck: Normal range of motion. Neck supple.  Cardiovascular: Normal rate and regular rhythm.   No murmur heard. Pulmonary/Chest: Effort normal and breath sounds normal.  Abdominal: Soft. Bowel sounds are normal. There is no tenderness.  Musculoskeletal: She exhibits no edema.  Neurological: She is alert and oriented to person, place, and time.  Skin: Skin is warm and dry.  Psychiatric: She has a normal mood and affect.  Nursing note and vitals reviewed.   BP 118/80 (BP Location: Left Arm, Patient Position: Sitting, Cuff Size: Normal)   Pulse 62   Temp 97.8 F (36.6 C) (Oral)   Ht _0  (1.651 m)   Wt 179 lb (81.2 kg)   SpO2 97%   BMI 29.79 kg/m  Wt Readings from Last 3 Encounters:  04/06/16 179 lb (81.2 kg)  10/04/15 183 lb 2 oz (83.1 kg)  04/05/15 172 lb 2 oz (78.1 kg)     Lab Results  Component Value Date   WBC 5.7 10/04/2015   HGB 12.6 10/04/2015   HCT 36.6 10/04/2015   PLT 179.0 10/04/2015   GLUCOSE 96 10/04/2015   CHOL 180 03/29/2015   TRIG 55.0 03/29/2015   HDL 88.80 03/29/2015   LDLDIRECT 87.7 08/23/2012   LDLCALC 80 03/29/2015   ALT 13 10/04/2015   AST 16 10/04/2015   NA 140 10/04/2015   K 3.9 10/04/2015   CL 101 10/04/2015   CREATININE 0.87 10/04/2015   BUN 15 10/04/2015   CO2 32 10/04/2015   TSH 0.77 10/04/2015   HGBA1C 5.1 03/29/2015    Lab Results  Component Value Date   TSH 0.77 10/04/2015   Lab Results  Component Value Date   WBC 5.7 10/04/2015   HGB 12.6 10/04/2015   HCT 36.6 10/04/2015   MCV 95.4 10/04/2015   PLT 179.0 10/04/2015   Lab Results  Component Value Date   NA 140 10/04/2015   K 3.9 10/04/2015   CO2 32 10/04/2015   GLUCOSE 96 10/04/2015   BUN 15 10/04/2015   CREATININE 0.87 10/04/2015    BILITOT 0.5 10/04/2015   ALKPHOS 55 10/04/2015   AST 16 10/04/2015   ALT 13 10/04/2015   PROT 7.3 10/04/2015   ALBUMIN 4.3 10/04/2015   CALCIUM 9.5 10/04/2015   GFR 72.36 10/04/2015   Lab Results  Component Value Date   CHOL 180 03/29/2015   Lab Results  Component Value Date   HDL 88.80 03/29/2015   Lab Results  Component Value Date   LDLCALC 80 03/29/2015   Lab Results  Component Value Date   TRIG 55.0  03/29/2015   Lab Results  Component Value Date   CHOLHDL 2 03/29/2015   Lab Results  Component Value Date   HGBA1C 5.1 03/29/2015       Assessment & Plan:   Problem List Items Addressed This Visit    Hypothyroidism    On Levothyroxine, continue to monitor      Essential hypertension - Primary    Well controlled, no changes to meds. Encouraged heart healthy diet such as the DASH diet and exercise as tolerated.       Relevant Medications   olmesartan-hydrochlorothiazide (BENICAR HCT) 20-12.5 MG tablet   Other Relevant Orders   CBC   Comp Met (CMET)   Lipid panel   TSH   Hyperglycemia     minimize simple carbs. Increase exercise as tolerated.      Relevant Orders   Hemoglobin A1c   Preventative health care   Relevant Orders   Lipid panel    Other Visit Diagnoses   None.     I am having Ms. Pro maintain her multivitamin, calcium-vitamin D, ibuprofen, levothyroxine, and olmesartan-hydrochlorothiazide.  Meds ordered this encounter  Medications  . olmesartan-hydrochlorothiazide (BENICAR HCT) 20-12.5 MG tablet    Sig: Take 1 tablet by mouth daily.    Dispense:  90 tablet    Refill:  2     [name], MD

## 2016-04-15 NOTE — Assessment & Plan Note (Signed)
Well controlled, no changes to meds. Encouraged heart healthy diet such as the DASH diet and exercise as tolerated.  °

## 2016-05-23 ENCOUNTER — Other Ambulatory Visit: Payer: Self-pay | Admitting: Family Medicine

## 2016-05-23 DIAGNOSIS — Z1231 Encounter for screening mammogram for malignant neoplasm of breast: Secondary | ICD-10-CM

## 2016-06-23 ENCOUNTER — Ambulatory Visit: Payer: Managed Care, Other (non HMO)

## 2016-07-19 ENCOUNTER — Ambulatory Visit
Admission: RE | Admit: 2016-07-19 | Discharge: 2016-07-19 | Disposition: A | Discharge status: Home / Self Care | Payer: Managed Care, Other (non HMO) | Source: Ambulatory Visit | Attending: Family Medicine | Admitting: Family Medicine

## 2016-07-19 DIAGNOSIS — Z1231 Encounter for screening mammogram for malignant neoplasm of breast: Secondary | ICD-10-CM

## 2016-09-29 ENCOUNTER — Other Ambulatory Visit (INDEPENDENT_AMBULATORY_CARE_PROVIDER_SITE_OTHER): Payer: 59

## 2016-09-29 DIAGNOSIS — Z0000 Encounter for general adult medical examination without abnormal findings: Secondary | ICD-10-CM | POA: Diagnosis not present

## 2016-09-29 DIAGNOSIS — I10 Essential (primary) hypertension: Secondary | ICD-10-CM | POA: Diagnosis not present

## 2016-09-29 LAB — CBC
HCT: 41.4 % (ref 36.0–46.0)
HEMOGLOBIN: 14.1 g/dL (ref 12.0–15.0)
MCHC: 34 g/dL (ref 30.0–36.0)
MCV: 97.4 fl (ref 78.0–100.0)
PLATELETS: 207 10*3/uL (ref 150.0–400.0)
RBC: 4.25 Mil/uL (ref 3.87–5.11)
RDW: 13.3 % (ref 11.5–15.5)
WBC: 5.2 10*3/uL (ref 4.0–10.5)

## 2016-09-29 LAB — LIPID PANEL
CHOLESTEROL: 211 mg/dL — AB (ref 0–200)
HDL: 85.7 mg/dL (ref >39.00)
LDL Cholesterol: 98 mg/dL (ref 0–99)
NONHDL: 125.14
Total CHOL/HDL Ratio: 2
Triglycerides: 137 mg/dL (ref 0.0–149.0)
VLDL: 27.4 mg/dL (ref 0.0–40.0)

## 2016-09-29 LAB — COMPREHENSIVE METABOLIC PANEL
ALK PHOS: 47 U/L (ref 39–117)
ALT: 16 U/L (ref 0–35)
AST: 15 U/L (ref 0–37)
Albumin: 4.5 g/dL (ref 3.5–5.2)
BILIRUBIN TOTAL: 0.5 mg/dL (ref 0.2–1.2)
BUN: 21 mg/dL (ref 6–23)
CO2: 26 meq/L (ref 19–32)
Calcium: 9.9 mg/dL (ref 8.4–10.5)
Chloride: 101 mEq/L (ref 96–112)
Creatinine, Ser: 0.94 mg/dL (ref 0.40–1.20)
GFR: 65.93 mL/min (ref >60.00)
GLUCOSE: 92 mg/dL (ref 70–99)
POTASSIUM: 4 meq/L (ref 3.5–5.1)
SODIUM: 136 meq/L (ref 135–145)
TOTAL PROTEIN: 7.5 g/dL (ref 6.0–8.3)

## 2016-09-29 LAB — TSH: TSH: 4.04 u[IU]/mL (ref 0.35–4.50)

## 2016-09-29 LAB — HEMOGLOBIN A1C: HEMOGLOBIN A1C: 4.9 % (ref 4.6–6.5)

## 2016-10-06 ENCOUNTER — Ambulatory Visit (INDEPENDENT_AMBULATORY_CARE_PROVIDER_SITE_OTHER): Payer: 59 | Admitting: Family Medicine

## 2016-10-06 ENCOUNTER — Encounter: Payer: Self-pay | Admitting: Family Medicine

## 2016-10-06 VITALS — BP 98/67 | HR 80 | Temp 98.3°F | Ht 65.0 in | Wt 182.6 lb

## 2016-10-06 DIAGNOSIS — Z7289 Other problems related to lifestyle: Secondary | ICD-10-CM

## 2016-10-06 DIAGNOSIS — E039 Hypothyroidism, unspecified: Secondary | ICD-10-CM

## 2016-10-06 DIAGNOSIS — Z23 Encounter for immunization: Secondary | ICD-10-CM | POA: Diagnosis not present

## 2016-10-06 DIAGNOSIS — R739 Hyperglycemia, unspecified: Secondary | ICD-10-CM

## 2016-10-06 DIAGNOSIS — E785 Hyperlipidemia, unspecified: Secondary | ICD-10-CM | POA: Diagnosis not present

## 2016-10-06 DIAGNOSIS — E6609 Other obesity due to excess calories: Secondary | ICD-10-CM | POA: Diagnosis not present

## 2016-10-06 DIAGNOSIS — I10 Essential (primary) hypertension: Secondary | ICD-10-CM

## 2016-10-06 DIAGNOSIS — Z0000 Encounter for general adult medical examination without abnormal findings: Secondary | ICD-10-CM

## 2016-10-06 DIAGNOSIS — E782 Mixed hyperlipidemia: Secondary | ICD-10-CM

## 2016-10-06 HISTORY — DX: Mixed hyperlipidemia: E78.2

## 2016-10-06 NOTE — Progress Notes (Signed)
Pre visit review using our clinic review tool, if applicable. No additional management support is needed unless otherwise documented below in the visit note. 

## 2016-10-06 NOTE — Assessment & Plan Note (Signed)
Patient encouraged to maintain heart healthy diet, regular exercise, adequate sleep. Consider daily probiotics. Take medications as prescribed. She has advanced directives written but has yet to get [place] a copy, she will try and forward these. Labs reviewed

## 2016-10-06 NOTE — Progress Notes (Signed)
Patient ID: Cindy Perkins, female   DOB: [birth year], 56 y.o.   MRN: [id number]   Subjective:  I acted as a Education administrator for [name], [place], [place]   Patient ID: Cindy Perkins, female    DOB: 26-Nov-1960, 56 y.o.   MRN: [id number]  Chief Complaint  Patient presents with  . Annual Exam  . Hypertension  . Hyperglycemia    Hypertension  This is a chronic problem. The problem is controlled. Pertinent negatives include no blurred vision, chest pain, headaches, malaise/fatigue, palpitations or shortness of breath.  Hyperglycemia  This is a chronic problem. Pertinent negatives include no chest pain, congestion, coughing, fever, headaches, rash or vomiting.    Patient is in today for an annual examination. Patient has a Hx of HTN, hyperglycemia, hypothyroidism. Patient has no acute concerns noted at this time. 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 Denies CP/palp/SOB/HA/congestion/fevers/GI or GU c/o. Taking meds as prescribed  Patient Care Team: [name], MD as PCP - General (Family Medicine)   Past Medical History:  Diagnosis Date  . ALLERGIC RHINITIS 03/28/2007  . Allergy   . Cervical cancer screening 04/05/2015  . Chicken pox as a child  . [place], FEMALE 04/03/2008  . Diverticulosis 04/11/2015   By colonoscopy in November 2015  . Hyperglycemia 03/29/2014  . Hyperlipidemia, mixed 10/06/2016  . HYPERTENSION, SYSTOLIC, BORDERLINE 7/82/9562   on medicine for BP  . HYPOTHYROIDISM 03/28/2007  . Obesity 05/18/2010   Qualifier: Diagnosis of  By: [name] MD, Balinda Quails   . Postmenopausal bleeding 05/03/2009  . Preventative health care 04/11/2015  . Sacroiliac dysfunction 10/04/2015    Past Surgical History:  Procedure Laterality Date  . KNEE SURGERY Left    surgical repair of injury  . TUBAL LIGATION  1995    Family History  Problem Relation Age of  Onset  . Dementia Mother   . Hypertension Mother   . Hyperlipidemia Mother   . COPD Mother     smoker but quit for 30 yrs  . Dementia Father   . Diabetes Father     type 2  . Hypertension Father   . Obesity Brother   . Cancer Brother 58    bone  . Heart attack Maternal Grandfather   . Diabetes Paternal Grandfather     type 2  . Obesity Brother   . Pulmonary embolism Brother   . COPD Paternal Uncle   . Colon cancer Neg Hx   . Rectal cancer Neg Hx   . Stomach cancer Neg Hx     Social History   Social History  . Marital status: Married    Spouse name: N/A  . Number of children: N/A  . Years of education: N/A   Occupational History  . Not on file.   Social History Main Topics  . Smoking status: Never Smoker  . Smokeless tobacco: Never Used  . Alcohol use Yes     Comment: 12 beers weekly  . Drug use: No  . Sexual activity: Yes    Birth control/ protection: Post-menopausal     Comment: lives with husband, works for [employer], no dietary restrictions   Other Topics Concern  . Not on file   Social History Narrative  . No narrative on file    Outpatient Medications Prior to Visit  Medication Sig Dispense Refill  .  Calcium Carbonate-Vitamin D (CALCIUM-VITAMIN D) 500-200 MG-UNIT per tablet Take 1 tablet by mouth 2 (two) times daily with a meal. Reported on 10/01/2015    . ibuprofen (ADVIL,MOTRIN) 200 MG tablet Take 400 mg by mouth every 6 (six) hours as needed.    Marland Kitchen levothyroxine (SYNTHROID, LEVOTHROID) 100 MCG tablet TAKE 1 TABLET (100 MCG TOTAL) BY MOUTH DAILY BEFORE BREAKFAST. 90 tablet 2  . Multiple Vitamin (MULTIVITAMIN) capsule Take 1 capsule by mouth daily.      Marland Kitchen olmesartan-hydrochlorothiazide (BENICAR HCT) 20-12.5 MG tablet Take 1 tablet by mouth daily. 90 tablet 2   No facility-administered medications prior to visit.     Allergies  Allergen Reactions  . Penicillins Swelling    Review of Systems  Constitutional: Negative for fever and  malaise/fatigue.  HENT: Negative for congestion.   Eyes: Negative for blurred vision.  Respiratory: Negative for cough and shortness of breath.   Cardiovascular: Negative for chest pain, palpitations and leg swelling.  Gastrointestinal: Negative for vomiting.  Musculoskeletal: Negative for back pain.  Skin: Negative for rash.  Neurological: Negative for loss of consciousness and headaches.       Objective:    Physical Exam  Constitutional: She is oriented to person, place, and time. She appears well-developed and well-nourished. No distress.  HENT:  Head: Normocephalic and atraumatic.  Eyes: Conjunctivae are normal.  Neck: Normal range of motion. No thyromegaly present.  Cardiovascular: Normal rate and regular rhythm.   Pulmonary/Chest: Effort normal and breath sounds normal. She has no wheezes.  Abdominal: Soft. Bowel sounds are normal. There is no tenderness. There is no rebound and no guarding.  Musculoskeletal: She exhibits no edema or deformity.  Neurological: She is alert and oriented to person, place, and time.  Skin: Skin is warm and dry. She is not diaphoretic.  Psychiatric: She has a normal mood and affect.    BP 98/67 (BP Location: Left Arm, Patient Position: Sitting, Cuff Size: Small)   Pulse 80   Temp 98.3 F (36.8 C) (Oral)   Ht 5\' 5"  (1.651 m)   Wt 182 lb 9.6 oz (82.8 kg)   SpO2 98% Comment: RA  BMI 30.39 kg/m  Wt Readings from Last 3 Encounters:  10/06/16 182 lb 9.6 oz (82.8 kg)  04/06/16 179 lb (81.2 kg)  10/04/15 183 lb 2 oz (83.1 kg)   BP Readings from Last 3 Encounters:  10/06/16 98/67  04/06/16 118/80  10/04/15 118/72     Immunization History  Administered Date(s) Administered  . Influenza Whole 04/03/2008, 05/18/2010  . Influenza, Seasonal, Injecte, Preservative Fre 05/17/2014  . Td 07/17/2005  . Tdap 10/06/2016    Health Maintenance  Topic Date Due  . Hepatitis C Screening  05/19/1963  . HIV Screening  08/23/1977  . INFLUENZA VACCINE   02/15/2016  . PAP SMEAR  04/04/2018  . MAMMOGRAM  07/19/2018  . COLONOSCOPY  06/06/2019  . TETANUS/TDAP  10/07/2026    Lab Results  Component Value Date   WBC 5.2 09/29/2016   HGB 14.1 09/29/2016   HCT 41.4 09/29/2016   PLT 207.0 09/29/2016   GLUCOSE 92 09/29/2016   CHOL 211 (H) 09/29/2016   TRIG 137.0 09/29/2016   HDL 85.70 09/29/2016   LDLDIRECT 87.7 08/23/2012   LDLCALC 98 09/29/2016   ALT 16 09/29/2016   AST 15 09/29/2016   NA 136 09/29/2016   K 4.0 09/29/2016   CL 101 09/29/2016   CREATININE 0.94 09/29/2016   BUN 21 09/29/2016   CO2 26  09/29/2016   TSH 4.04 09/29/2016   HGBA1C 4.9 09/29/2016    Lab Results  Component Value Date   TSH 4.04 09/29/2016   Lab Results  Component Value Date   WBC 5.2 09/29/2016   HGB 14.1 09/29/2016   HCT 41.4 09/29/2016   MCV 97.4 09/29/2016   PLT 207.0 09/29/2016   Lab Results  Component Value Date   NA 136 09/29/2016   K 4.0 09/29/2016   CO2 26 09/29/2016   GLUCOSE 92 09/29/2016   BUN 21 09/29/2016   CREATININE 0.94 09/29/2016   BILITOT 0.5 09/29/2016   ALKPHOS 47 09/29/2016   AST 15 09/29/2016   ALT 16 09/29/2016   PROT 7.5 09/29/2016   ALBUMIN 4.5 09/29/2016   CALCIUM 9.9 09/29/2016   GFR 65.93 09/29/2016   Lab Results  Component Value Date   CHOL 211 (H) 09/29/2016   Lab Results  Component Value Date   HDL 85.70 09/29/2016   Lab Results  Component Value Date   LDLCALC 98 09/29/2016   Lab Results  Component Value Date   TRIG 137.0 09/29/2016   Lab Results  Component Value Date   CHOLHDL 2 09/29/2016   Lab Results  Component Value Date   HGBA1C 4.9 09/29/2016         Assessment & Plan:   Problem List Items Addressed This Visit    Hypothyroidism    On Levothyroxine, continue to monitor      Relevant Orders   TSH   Essential hypertension    Well controlled, no changes to meds. Encouraged heart healthy diet such as the DASH diet and exercise as tolerated.       Relevant Orders   CBC    Comprehensive metabolic panel   Obesity    Encouraged DASH diet, decrease po intake and increase exercise as tolerated. Needs 7-8 hours of sleep nightly. Avoid trans fats, eat small, frequent meals every 4-5 hours with lean proteins, complex carbs and healthy fats. Minimize simple carbs.referred to bariatrics.      Relevant Orders   Hemoglobin A1c   Hyperglycemia - Primary    hgba1c acceptable, minimize simple carbs. Increase exercise as tolerated.      Relevant Orders   Hemoglobin A1c   Preventative health care    Patient encouraged to maintain heart healthy diet, regular exercise, adequate sleep. Consider daily probiotics. Take medications as prescribed. She has advanced directives written but has yet to get [place] a copy, she will try and forward these. Labs reviewed      Hyperlipidemia    Encouraged heart healthy diet, increase exercise, avoid trans fats, consider a krill oil cap daily      Relevant Orders   Lipid panel   Other problems related to lifestyle   Relevant Orders   Hepatitis C antibody      I am having Ms. Weltman maintain her multivitamin, calcium-vitamin D, ibuprofen, levothyroxine, and olmesartan-hydrochlorothiazide.  No orders of the defined types were placed in this encounter.   CMA served as Education administrator during this visit. History, Physical and Plan performed by medical provider. Documentation and orders reviewed and attested to.  [name], MD

## 2016-10-06 NOTE — Assessment & Plan Note (Signed)
On Levothyroxine, continue to monitor 

## 2016-10-06 NOTE — Patient Instructions (Addendum)
MegaRed krill oil cap regular strength daily Vitamin D 2000 IU daily [place] 10 strain probiotic cap daily, [facility] or [email or web address]  Bring [place] a copy of your advanced directives  Preventive Care 40-64 Years, Female Preventive care refers to lifestyle choices and visits with your health care provider that can promote health and wellness. What does preventive care include?  A yearly physical exam. This is also called an annual well check.  Dental exams once or twice a year.  Routine eye exams. Ask your health care provider how often you should have your eyes checked.  Personal lifestyle choices, including:  Daily care of your teeth and gums.  Regular physical activity.  Eating a healthy diet.  Avoiding tobacco and drug use.  Limiting alcohol use.  Practicing safe sex.  Taking low-dose aspirin daily starting at age 84.  Taking vitamin and mineral supplements as recommended by your health care provider. What happens during an annual well check? The services and screenings done by your health care provider during your annual well check will depend on your age, overall health, lifestyle risk factors, and family history of disease. Counseling  Your health care provider may ask you questions about your:  Alcohol use.  Tobacco use.  Drug use.  Emotional well-being.  Home and relationship well-being.  Sexual activity.  Eating habits.  Work and work Statistician.  Method of birth control.  Menstrual cycle.  Pregnancy history. Screening  You may have the following tests or measurements:  Height, weight, and BMI.  Blood pressure.  Lipid and cholesterol levels. These may be checked every 5 years, or more frequently if you are over 27 years old.  Skin check.  Lung cancer screening. You may have this screening every year starting at age 34 if you have a 30-pack-year history of smoking and currently smoke or have quit within the past 15 years.  Fecal occult  blood test (FOBT) of the stool. You may have this test every year starting at age 62.  Flexible sigmoidoscopy or colonoscopy. You may have a sigmoidoscopy every 5 years or a colonoscopy every 10 years starting at age 78.  Hepatitis C blood test.  Hepatitis B blood test.  Sexually transmitted disease (STD) testing.  Diabetes screening. This is done by checking your blood sugar (glucose) after you have not eaten for a while (fasting). You may have this done every 1-3 years.  Mammogram. This may be done every 1-2 years. Talk to your health care provider about when you should start having regular mammograms. This may depend on whether you have a family history of breast cancer.  BRCA-related cancer screening. This may be done if you have a family history of breast, ovarian, tubal, or peritoneal cancers.  Pelvic exam and Pap test. This may be done every 3 years starting at age 70. Starting at age 68, this may be done every 5 years if you have a Pap test in combination with an HPV test.  Bone density scan. This is done to screen for osteoporosis. You may have this scan if you are at high risk for osteoporosis. Discuss your test results, treatment options, and if necessary, the need for more tests with your health care provider. Vaccines  Your health care provider may recommend certain vaccines, such as:  Influenza vaccine. This is recommended every year.  Tetanus, diphtheria, and acellular pertussis (Tdap, Td) vaccine. You may need a Td booster every 10 years.  Varicella vaccine. You may need this if you have not  been vaccinated.  Zoster vaccine. You may need this after age 55.  Measles, mumps, and rubella (MMR) vaccine. You may need at least one dose of MMR if you were born in 1957 or later. You may also need a second dose.  Pneumococcal 13-valent conjugate (PCV13) vaccine. You may need this if you have certain conditions and were not previously vaccinated.  Pneumococcal polysaccharide  (PPSV23) vaccine. You may need one or two doses if you smoke cigarettes or if you have certain conditions.  Meningococcal vaccine. You may need this if you have certain conditions.  Hepatitis A vaccine. You may need this if you have certain conditions or if you travel or work in places where you may be exposed to hepatitis A.  Hepatitis B vaccine. You may need this if you have certain conditions or if you travel or work in places where you may be exposed to hepatitis B.  Haemophilus influenzae type b (Hib) vaccine. You may need this if you have certain conditions. Talk to your health care provider about which screenings and vaccines you need and how often you need them. This information is not intended to replace advice given to you by your health care provider. Make sure you discuss any questions you have with your health care provider. Document Released: 07/30/2015 Document Revised: 03/22/2016 Document Reviewed: 05/04/2015 Elsevier Interactive Patient Education  2017 [employer].

## 2016-10-06 NOTE — Assessment & Plan Note (Signed)
Encouraged heart healthy diet, increase exercise, avoid trans fats, consider a krill oil cap daily 

## 2016-10-06 NOTE — Assessment & Plan Note (Signed)
Well controlled, no changes to meds. Encouraged heart healthy diet such as the DASH diet and exercise as tolerated.  °

## 2016-10-06 NOTE — Assessment & Plan Note (Signed)
Encouraged DASH diet, decrease po intake and increase exercise as tolerated. Needs 7-8 hours of sleep nightly. Avoid trans fats, eat small, frequent meals every 4-5 hours with lean proteins, complex carbs and healthy fats. Minimize simple carbs.referred to bariatrics.

## 2016-10-08 NOTE — Assessment & Plan Note (Signed)
hgba1c acceptable, minimize simple carbs. Increase exercise as tolerated.  

## 2016-12-28 ENCOUNTER — Other Ambulatory Visit: Payer: Self-pay | Admitting: Family Medicine

## 2017-03-18 ENCOUNTER — Other Ambulatory Visit: Payer: Self-pay | Admitting: Family Medicine

## 2017-03-19 IMAGING — DX DG HIP (WITH OR WITHOUT PELVIS) 2-3V*L*
3 series · 3 of 3 positions shown · non-contrast
Comparison: None.

CLINICAL DATA: Left hip pain.

EXAM:
DG HIP (WITH OR WITHOUT PELVIS) 2-3V LEFT

[hip ap]
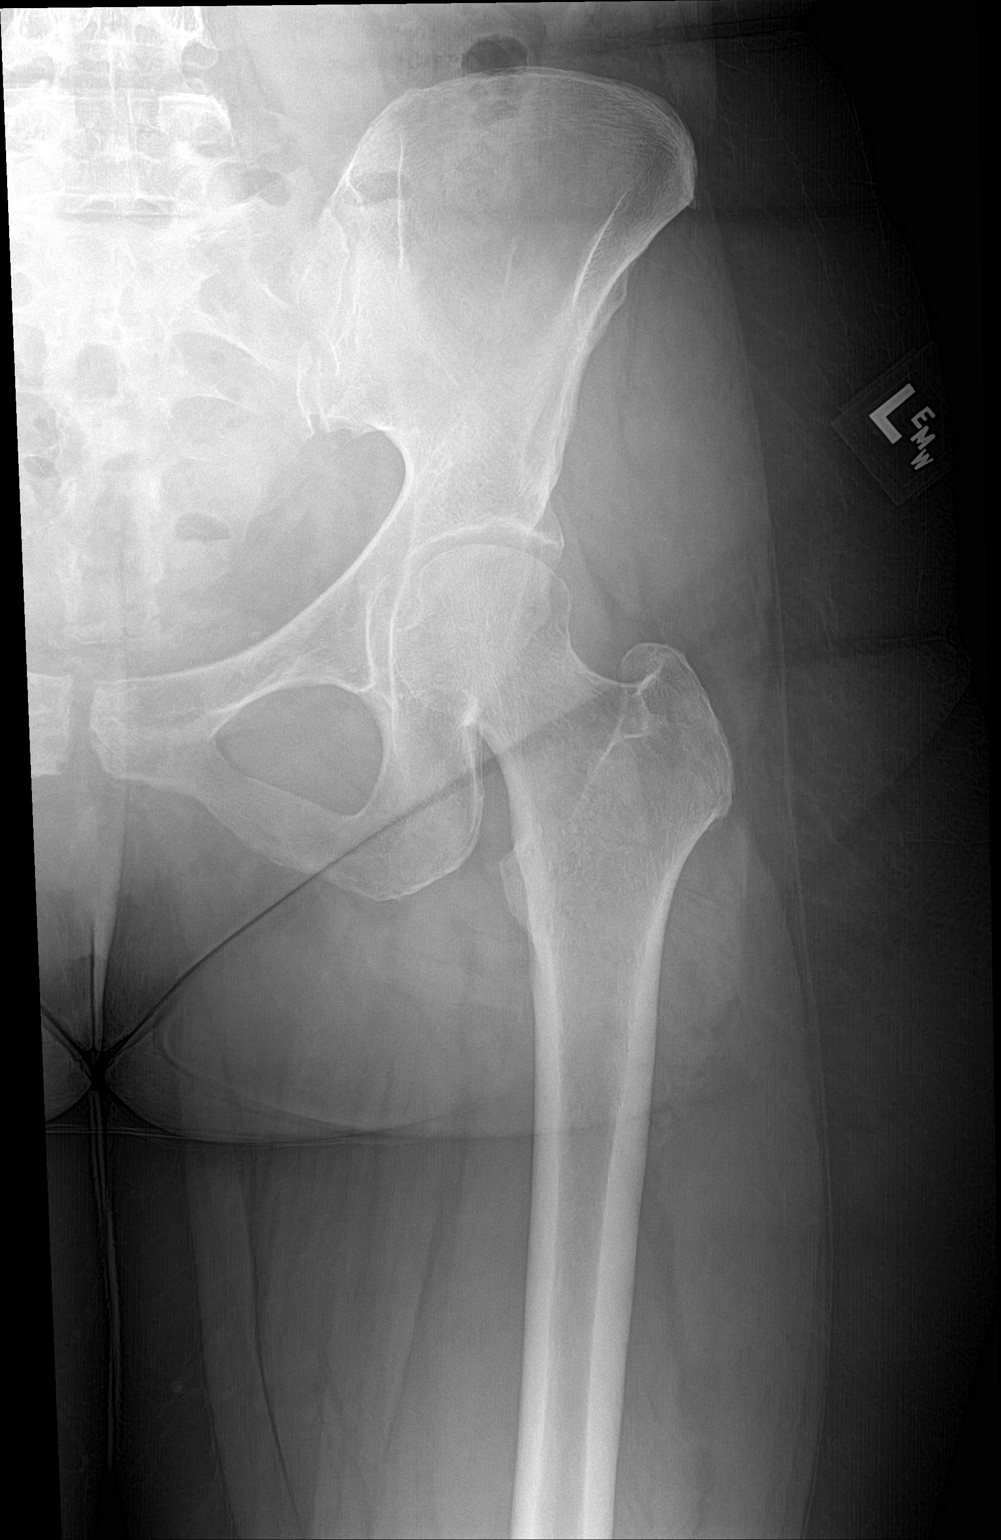

[hip lat]
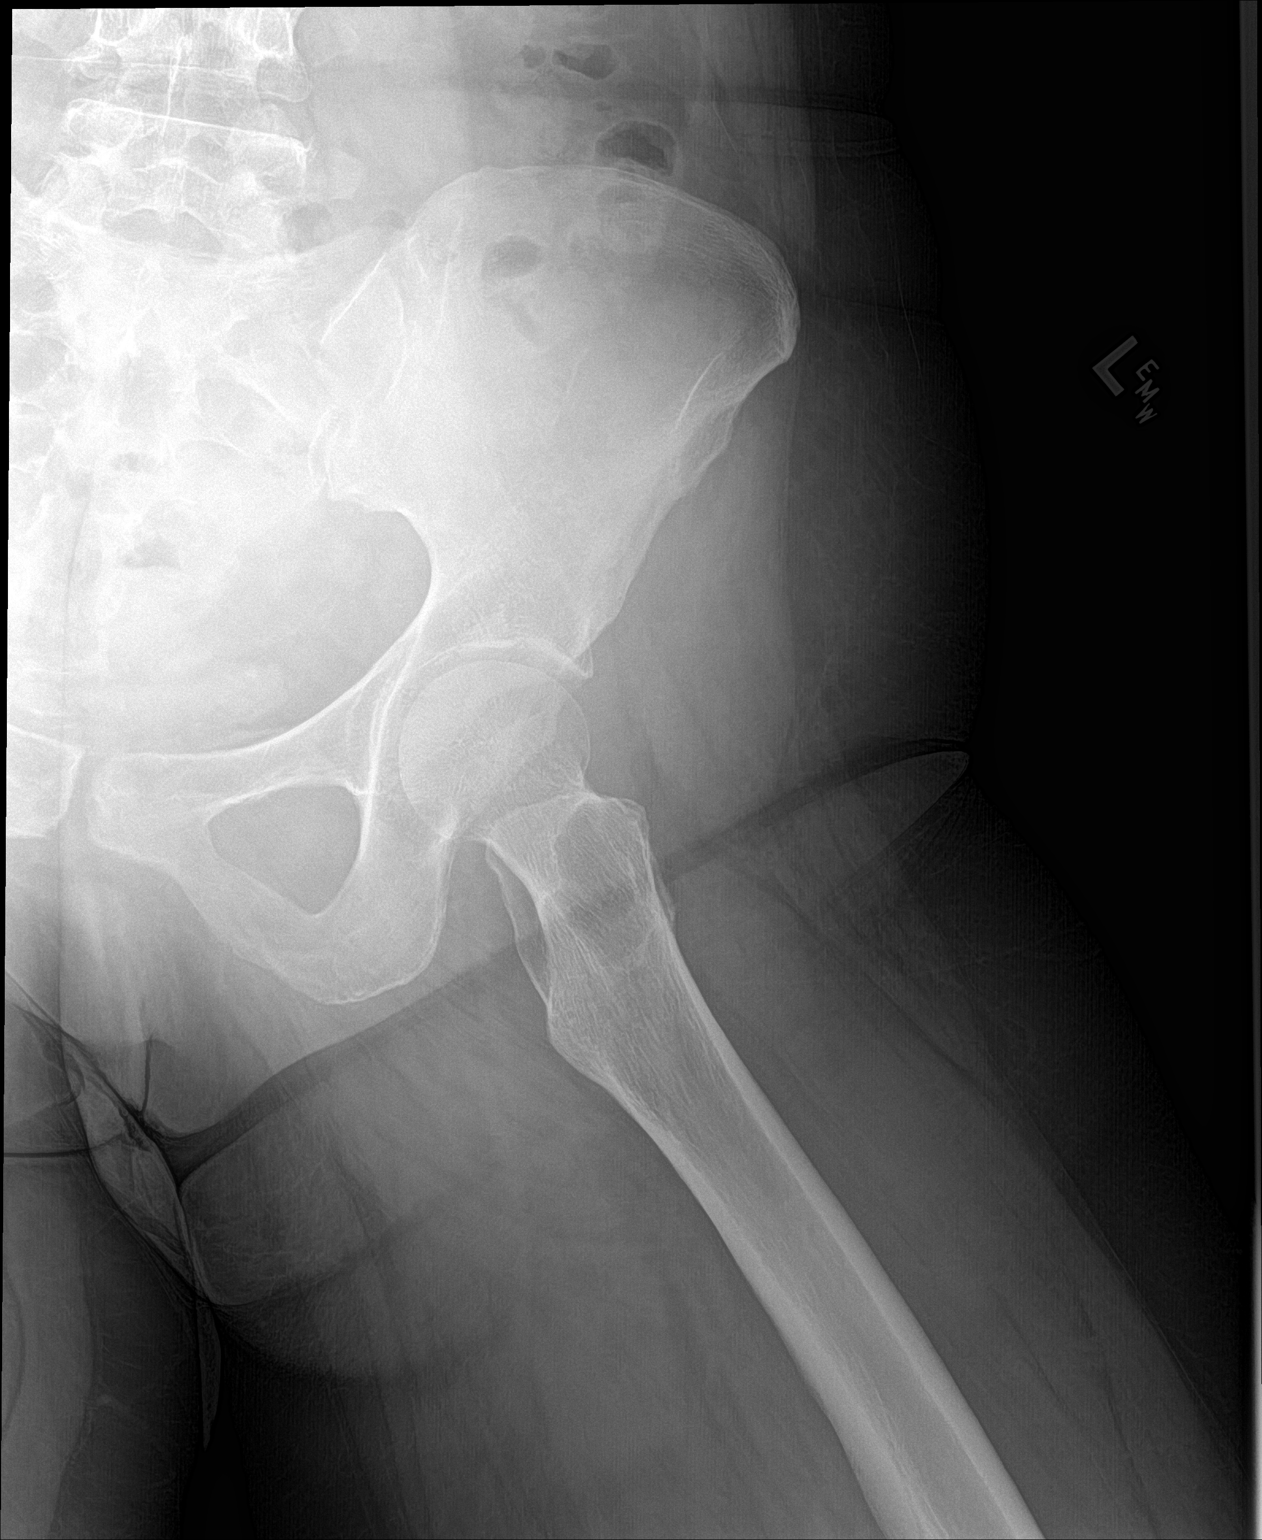

[standing ap pelvis]
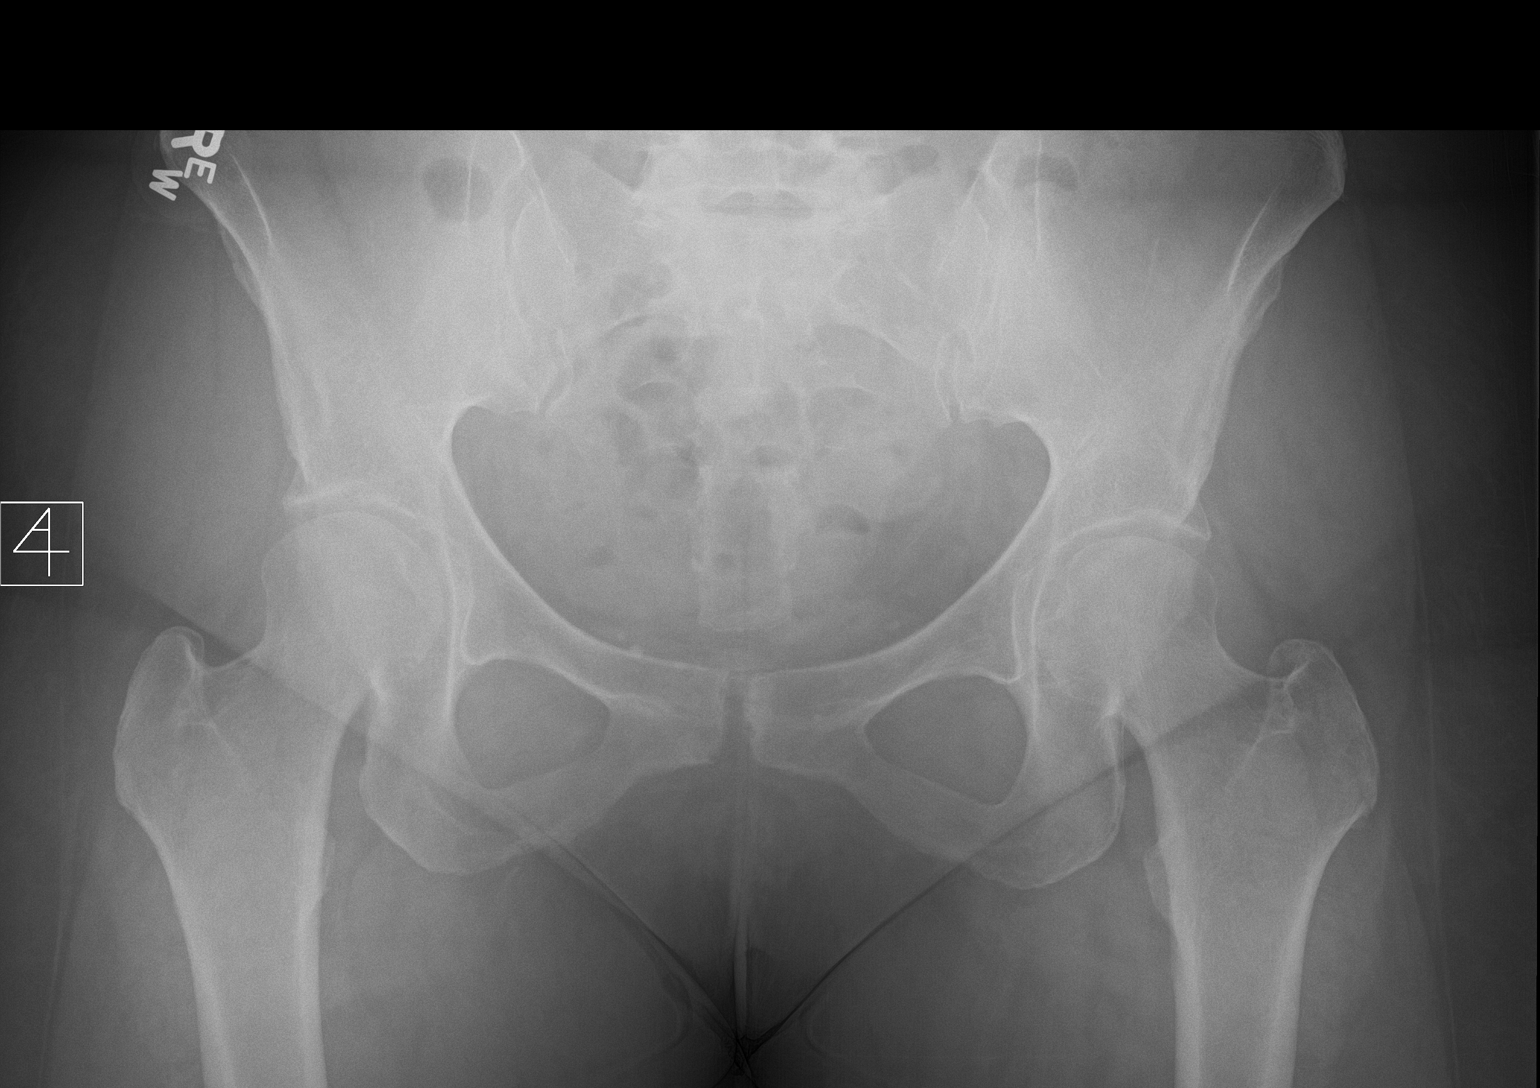

[3 of 3 positions shown; findings below may reference images not displayed]

FINDINGS: No acute fracture. No dislocation. Unremarkable soft tissues. Pelvic
phleboliths.
IMPRESSION: No acute bony pathology

## 2017-08-01 ENCOUNTER — Other Ambulatory Visit: Payer: Self-pay | Admitting: Family Medicine

## 2017-08-01 DIAGNOSIS — Z1231 Encounter for screening mammogram for malignant neoplasm of breast: Secondary | ICD-10-CM

## 2017-08-21 ENCOUNTER — Ambulatory Visit
Admission: RE | Admit: 2017-08-21 | Discharge: 2017-08-21 | Disposition: A | Discharge status: Home / Self Care | Payer: 59 | Source: Ambulatory Visit | Attending: Family Medicine | Admitting: Family Medicine

## 2017-08-21 DIAGNOSIS — Z1231 Encounter for screening mammogram for malignant neoplasm of breast: Secondary | ICD-10-CM

## 2017-10-08 ENCOUNTER — Ambulatory Visit (INDEPENDENT_AMBULATORY_CARE_PROVIDER_SITE_OTHER): Payer: 59 | Admitting: Family Medicine

## 2017-10-08 ENCOUNTER — Encounter: Payer: Self-pay | Admitting: Family Medicine

## 2017-10-08 ENCOUNTER — Other Ambulatory Visit (HOSPITAL_COMMUNITY)
Admission: RE | Admit: 2017-10-08 | Discharge: 2017-10-08 | Disposition: A | Discharge status: Home / Self Care | Payer: 59 | Source: Ambulatory Visit | Attending: Family Medicine | Admitting: Family Medicine

## 2017-10-08 VITALS — BP 118/68 | HR 73 | Temp 97.8°F | Resp 18 | Ht 64.0 in | Wt 178.0 lb

## 2017-10-08 DIAGNOSIS — E039 Hypothyroidism, unspecified: Secondary | ICD-10-CM | POA: Diagnosis not present

## 2017-10-08 DIAGNOSIS — Z0000 Encounter for general adult medical examination without abnormal findings: Secondary | ICD-10-CM

## 2017-10-08 DIAGNOSIS — E6609 Other obesity due to excess calories: Secondary | ICD-10-CM | POA: Diagnosis not present

## 2017-10-08 DIAGNOSIS — R739 Hyperglycemia, unspecified: Secondary | ICD-10-CM | POA: Diagnosis not present

## 2017-10-08 DIAGNOSIS — I10 Essential (primary) hypertension: Secondary | ICD-10-CM

## 2017-10-08 DIAGNOSIS — N898 Other specified noninflammatory disorders of vagina: Secondary | ICD-10-CM | POA: Diagnosis not present

## 2017-10-08 DIAGNOSIS — N761 Subacute and chronic vaginitis: Secondary | ICD-10-CM | POA: Diagnosis not present

## 2017-10-08 DIAGNOSIS — L309 Dermatitis, unspecified: Secondary | ICD-10-CM | POA: Diagnosis not present

## 2017-10-08 DIAGNOSIS — Z124 Encounter for screening for malignant neoplasm of cervix: Secondary | ICD-10-CM | POA: Insufficient documentation

## 2017-10-08 DIAGNOSIS — E785 Hyperlipidemia, unspecified: Secondary | ICD-10-CM

## 2017-10-08 DIAGNOSIS — N760 Acute vaginitis: Secondary | ICD-10-CM | POA: Insufficient documentation

## 2017-10-08 DIAGNOSIS — Z860100 Personal history of colon polyps, unspecified: Secondary | ICD-10-CM | POA: Insufficient documentation

## 2017-10-08 HISTORY — DX: Personal history of colonic polyps: Z86.010

## 2017-10-08 HISTORY — DX: Personal history of colon polyps, unspecified: Z86.0100

## 2017-10-08 LAB — TSH: TSH: 1.85 u[IU]/mL (ref 0.35–4.50)

## 2017-10-08 LAB — COMPREHENSIVE METABOLIC PANEL
ALBUMIN: 4.3 g/dL (ref 3.5–5.2)
ALT: 17 U/L (ref 0–35)
AST: 17 U/L (ref 0–37)
Alkaline Phosphatase: 56 U/L (ref 39–117)
BUN: 17 mg/dL (ref 6–23)
CO2: 32 mEq/L (ref 19–32)
Calcium: 10.2 mg/dL (ref 8.4–10.5)
Chloride: 104 mEq/L (ref 96–112)
Creatinine, Ser: 0.88 mg/dL (ref 0.40–1.20)
GFR: 70.87 mL/min (ref >60.00)
Glucose, Bld: 102 mg/dL — ABNORMAL HIGH (ref 70–99)
POTASSIUM: 5 meq/L (ref 3.5–5.1)
Sodium: 144 mEq/L (ref 135–145)
Total Bilirubin: 0.7 mg/dL (ref 0.2–1.2)
Total Protein: 7.9 g/dL (ref 6.0–8.3)

## 2017-10-08 LAB — LIPID PANEL
CHOLESTEROL: 191 mg/dL (ref 0–200)
HDL: 92.2 mg/dL (ref >39.00)
LDL Cholesterol: 78 mg/dL (ref 0–99)
NonHDL: 98.53
TRIGLYCERIDES: 102 mg/dL (ref 0.0–149.0)
Total CHOL/HDL Ratio: 2
VLDL: 20.4 mg/dL (ref 0.0–40.0)

## 2017-10-08 LAB — CBC
HEMATOCRIT: 41.7 % (ref 36.0–46.0)
HEMOGLOBIN: 14.1 g/dL (ref 12.0–15.0)
MCHC: 33.8 g/dL (ref 30.0–36.0)
MCV: 99.8 fl (ref 78.0–100.0)
Platelets: 191 10*3/uL (ref 150.0–400.0)
RBC: 4.17 Mil/uL (ref 3.87–5.11)
RDW: 13.2 % (ref 11.5–15.5)
WBC: 5.4 10*3/uL (ref 4.0–10.5)

## 2017-10-08 LAB — HEMOGLOBIN A1C: Hgb A1c MFr Bld: 4.8 % (ref 4.6–6.5)

## 2017-10-08 LAB — T4, FREE: Free T4: 0.96 ng/dL (ref 0.60–1.60)

## 2017-10-08 MED ORDER — OLMESARTAN MEDOXOMIL-HCTZ 20-12.5 MG PO TABS: 1.0000 | ORAL_TABLET | Freq: Every day | ORAL | 2 refills | Status: DC

## 2017-10-08 MED ORDER — NYSTATIN 100000 UNIT/GM EX CREA: 1.0000 "application " | TOPICAL_CREAM | Freq: Two times a day (BID) | CUTANEOUS | 1 refills | Status: DC | PRN

## 2017-10-08 MED ORDER — LEVOTHYROXINE SODIUM 100 MCG PO TABS: ORAL_TABLET | ORAL | 2 refills | Status: DC

## 2017-10-08 MED ORDER — FLUCONAZOLE 150 MG PO TABS: 150.0000 mg | ORAL_TABLET | ORAL | 0 refills | Status: DC

## 2017-10-08 NOTE — Assessment & Plan Note (Signed)
Colonoscopy 06/05/2014 with Dr Hilarie Fredrickson found 2 sessile polyps, repeat in 5 years

## 2017-10-08 NOTE — Assessment & Plan Note (Signed)
Well controlled, no changes to meds. Encouraged heart healthy diet such as the DASH diet and exercise as tolerated.  °

## 2017-10-08 NOTE — Assessment & Plan Note (Addendum)
hgba1c acceptable, minimize simple carbs. Increase exercise as tolerated.  

## 2017-10-08 NOTE — Assessment & Plan Note (Signed)
Pap today, no concerns on exam.  

## 2017-10-08 NOTE — Assessment & Plan Note (Signed)
Given rx for Nystatin and Diflucan report if no improvement

## 2017-10-08 NOTE — Progress Notes (Signed)
Subjective:  I acted as a Education administrator for Dr. Charlett Blake. [place], [place]  Patient ID: Cindy Perkins, female    DOB: 1960/12/07, 57 y.o.   MRN: [id number]  No chief complaint on file.   HPI  Patient is in today for an annual exam and follow up on chronic medical concerns including hypertension, hyperlipidemia, and hyperglycemia. She reports feeling well most days. No recent febrile illness or hospitalization. She is noting an increase in hot flashes and sleep disruption lately. Is trying to maintain a heart healthy diet and exercises some. No polyuria or polydipsia. Denies CP/palp/SOB/HA/congestion/fevers/GI or GU c/o. Taking meds as prescribed  Patient Care Team: [name], MD as PCP - General (Family Medicine)   Past Medical History:  Diagnosis Date  . ALLERGIC RHINITIS 03/28/2007  . Allergy   . Cervical cancer screening 04/05/2015  . Chicken pox as a child  . [place], FEMALE 04/03/2008  . Diverticulosis 04/11/2015   By colonoscopy in November 2015  . Hx of colonic polyp 10/08/2017  . Hyperglycemia 03/29/2014  . Hyperlipidemia, mixed 10/06/2016  . HYPERTENSION, SYSTOLIC, BORDERLINE 6/96/7893   on medicine for BP  . HYPOTHYROIDISM 03/28/2007  . Obesity 05/18/2010   Qualifier: Diagnosis of  By: [name] MD, Balinda Quails   . Postmenopausal bleeding 05/03/2009  . Preventative health care 04/11/2015  . Sacroiliac dysfunction 10/04/2015    Past Surgical History:  Procedure Laterality Date  . KNEE SURGERY Left    surgical repair of injury  . TUBAL LIGATION  1995    Family History  Problem Relation Age of Onset  . Dementia Mother   . Hypertension Mother   . Hyperlipidemia Mother   . COPD Mother        smoker but quit for 30 yrs  . Dementia Father   . Diabetes Father        type 2  . Hypertension Father   . Obesity Brother   . Cancer Brother 58       bone  . Heart attack Maternal Grandfather   . Diabetes Paternal Grandfather        type 2  . Obesity Brother   . Pulmonary  embolism Brother   . COPD Paternal Uncle   . Colon cancer Neg Hx   . Rectal cancer Neg Hx   . Stomach cancer Neg Hx     Social History   Socioeconomic History  . Marital status: Married    Spouse name: Not on file  . Number of children: Not on file  . Years of education: Not on file  . Highest education level: Not on file  Occupational History  . Not on file  Social Needs  . Financial resource strain: Not on file  . Food insecurity:    Worry: Not on file    Inability: Not on file  . Transportation needs:    Medical: Not on file    Non-medical: Not on file  Tobacco Use  . Smoking status: Never Smoker  . Smokeless tobacco: Never Used  Substance and Sexual Activity  . Alcohol use: Yes    Comment: 12 beers weekly  . Drug use: No  . Sexual activity: Yes    Birth control/protection: Post-menopausal    Comment: lives with husband, works for [employer], no dietary restrictions  Lifestyle  . Physical activity:    Days per week: Not on file    Minutes per session: Not on file  . Stress: Not on file  Relationships  . Social connections:    Talks on phone: Not on file    Gets together: Not on file    Attends religious service: Not on file    Active member of club or organization: Not on file    Attends meetings of clubs or organizations: Not on file    Relationship status: Not on file  . Intimate partner violence:    Fear of current or ex partner: Not on file    Emotionally abused: Not on file    Physically abused: Not on file    Forced sexual activity: Not on file  Other Topics Concern  . Not on file  Social History Narrative  . Not on file    Outpatient Medications Prior to Visit  Medication Sig Dispense Refill  . Calcium Carbonate-Vitamin D (CALCIUM-VITAMIN D) 500-200 MG-UNIT per tablet Take 1 tablet by mouth 2 (two) times daily with a meal. Reported on 10/01/2015    . ibuprofen (ADVIL,MOTRIN) 200 MG tablet Take 400 mg by mouth every 6 (six) hours as needed.     . Multiple Vitamin (MULTIVITAMIN) capsule Take 1 capsule by mouth daily.      Marland Kitchen levothyroxine (SYNTHROID, LEVOTHROID) 100 MCG tablet TAKE 1 TABLET (100 MCG TOTAL) BY MOUTH DAILY BEFORE BREAKFAST. 90 tablet 2  . olmesartan-hydrochlorothiazide (BENICAR HCT) 20-12.5 MG tablet TAKE 1 TABLET BY MOUTH DAILY. 90 tablet 2   No facility-administered medications prior to visit.     Allergies  Allergen Reactions  . Penicillins Swelling    Review of Systems  Constitutional: Negative for fever and malaise/fatigue.  HENT: Negative for congestion.   Eyes: Negative for blurred vision.  Respiratory: Negative for shortness of breath.   Cardiovascular: Negative for chest pain, palpitations and leg swelling.  Gastrointestinal: Negative for abdominal pain, blood in stool and nausea.  Genitourinary: Negative for dysuria and frequency.  Musculoskeletal: Positive for neck pain. Negative for falls.  Skin: Positive for itching and rash.  Neurological: Negative for dizziness, loss of consciousness and headaches.  Endo/Heme/Allergies: Negative for environmental allergies.  Psychiatric/Behavioral: Negative for depression. The patient is not nervous/anxious.        Objective:    Physical Exam  Constitutional: She is oriented to person, place, and time. She appears well-developed and well-nourished. No distress.  HENT:  Head: Normocephalic and atraumatic.  Nose: Nose normal.  Eyes: Right eye exhibits no discharge. Left eye exhibits no discharge.  Neck: Normal range of motion. Neck supple.  Cardiovascular: Normal rate and regular rhythm.  No murmur heard. Pulmonary/Chest: Effort normal and breath sounds normal.  Abdominal: Soft. Bowel sounds are normal. There is no tenderness.  Genitourinary: Uterus normal. Vaginal discharge found.  Genitourinary Comments: Breast exam unremarkable b/l. No lesions, skin changes, concerns  Musculoskeletal: She exhibits no edema.  Neurological: She is alert and oriented  to person, place, and time.  Skin: Skin is warm and dry. Rash noted.  Erythematous raised plaques b/l anterior just above hips and similar lesions on vulva and axillae  Psychiatric: She has a normal mood and affect.  Nursing note and vitals reviewed.   BP 118/68 (BP Location: Left Arm, Patient Position: Sitting, Cuff Size: Normal)   Pulse 73   Temp 97.8 F (36.6 C) (Oral)   Resp 18   Ht 5\' 4"  (1.626 m)   Wt 178 lb (80.7 kg)   SpO2 97%   BMI 30.55 kg/m  Wt Readings from Last 3 Encounters:  10/08/17 178 lb (80.7 kg)  10/06/16 182  lb 9.6 oz (82.8 kg)  04/06/16 179 lb (81.2 kg)   BP Readings from Last 3 Encounters:  10/08/17 118/68  10/06/16 98/67  04/06/16 118/80     Immunization History  Administered Date(s) Administered  . Influenza Whole 04/03/2008, 05/18/2010  . Influenza, Seasonal, Injecte, Preservative Fre 05/17/2014  . Td 07/17/2005  . Tdap 10/06/2016    Health Maintenance  Topic Date Due  . Hepatitis C Screening  [birth year]  . HIV Screening  08/23/1977  . PAP SMEAR  04/04/2018  . COLONOSCOPY  06/06/2019  . MAMMOGRAM  08/22/2019  . TETANUS/TDAP  10/07/2026  . INFLUENZA VACCINE  Completed    Lab Results  Component Value Date   WBC 5.2 09/29/2016   HGB 14.1 09/29/2016   HCT 41.4 09/29/2016   PLT 207.0 09/29/2016   GLUCOSE 92 09/29/2016   CHOL 211 (H) 09/29/2016   TRIG 137.0 09/29/2016   HDL 85.70 09/29/2016   LDLDIRECT 87.7 08/23/2012   LDLCALC 98 09/29/2016   ALT 16 09/29/2016   AST 15 09/29/2016   NA 136 09/29/2016   K 4.0 09/29/2016   CL 101 09/29/2016   CREATININE 0.94 09/29/2016   BUN 21 09/29/2016   CO2 26 09/29/2016   TSH 4.04 09/29/2016   HGBA1C 4.9 09/29/2016    Lab Results  Component Value Date   TSH 4.04 09/29/2016   Lab Results  Component Value Date   WBC 5.2 09/29/2016   HGB 14.1 09/29/2016   HCT 41.4 09/29/2016   MCV 97.4 09/29/2016   PLT 207.0 09/29/2016   Lab Results  Component Value Date   NA 136 09/29/2016   K  4.0 09/29/2016   CO2 26 09/29/2016   GLUCOSE 92 09/29/2016   BUN 21 09/29/2016   CREATININE 0.94 09/29/2016   BILITOT 0.5 09/29/2016   ALKPHOS 47 09/29/2016   AST 15 09/29/2016   ALT 16 09/29/2016   PROT 7.5 09/29/2016   ALBUMIN 4.5 09/29/2016   CALCIUM 9.9 09/29/2016   GFR 65.93 09/29/2016   Lab Results  Component Value Date   CHOL 211 (H) 09/29/2016   Lab Results  Component Value Date   HDL 85.70 09/29/2016   Lab Results  Component Value Date   LDLCALC 98 09/29/2016   Lab Results  Component Value Date   TRIG 137.0 09/29/2016   Lab Results  Component Value Date   CHOLHDL 2 09/29/2016   Lab Results  Component Value Date   HGBA1C 4.9 09/29/2016         Assessment & Plan:   Problem List Items Addressed This Visit    Hypothyroidism    On Levothyroxine, continue to monitor      Relevant Medications   levothyroxine (SYNTHROID, LEVOTHROID) 100 MCG tablet   Other Relevant Orders   T4, free   TSH   Essential hypertension    Well controlled, no changes to meds. Encouraged heart healthy diet such as the DASH diet and exercise as tolerated.       Relevant Medications   olmesartan-hydrochlorothiazide (BENICAR HCT) 20-12.5 MG tablet   Other Relevant Orders   CBC   Comprehensive metabolic panel   Obesity    Five pound weight loss since last visit. Encouraged DASH or MIND diet, decrease po intake and increase exercise as tolerated. Needs 7-8 hours of sleep nightly. Avoid trans fats, eat small, frequent meals every 4-5 hours with lean proteins, complex carbs and healthy fats. Minimize simple carbs,       Hyperglycemia  hgba1c acceptable, minimize simple carbs. Increase exercise as tolerated      Relevant Orders   Hemoglobin A1c   Cervical cancer screening    Pap today, no concerns on exam.       Relevant Orders   Cytology - PAP   Preventative health care    Patient encouraged to maintain heart healthy diet, regular exercise, adequate sleep. Consider  daily probiotics. Take medications as prescribed. Labs today. She has completed Advanced Directives will bring [place] copies      Hyperlipidemia    Maintain heart healthy diet, increase exercise, avoid trans fats, consider a krill oil cap daily      Relevant Medications   olmesartan-hydrochlorothiazide (BENICAR HCT) 20-12.5 MG tablet   Other Relevant Orders   Lipid panel   Hx of colonic polyp    Colonoscopy 06/05/2014 with Dr Hilarie Fredrickson found 2 sessile polyps, repeat in 5 years      Vaginitis    Thin white discharge and a fungal type rash over exterior labia. Fluconazole weekly x 2 and Nystatin cream bid      Dermatitis    Given rx for Nystatin and Diflucan report if no improvement       Other Visit Diagnoses    Vaginal discharge    -  Primary   Relevant Orders   Cytology - PAP      I am having Cindy Perkins start on nystatin cream and fluconazole. I am also having her maintain her multivitamin, calcium-vitamin D, ibuprofen, olmesartan-hydrochlorothiazide, and levothyroxine.  Meds ordered this encounter  Medications  . olmesartan-hydrochlorothiazide (BENICAR HCT) 20-12.5 MG tablet    Sig: Take 1 tablet by mouth daily.    Dispense:  90 tablet    Refill:  2  . levothyroxine (SYNTHROID, LEVOTHROID) 100 MCG tablet    Sig: TAKE 1 TABLET (100 MCG TOTAL) BY MOUTH DAILY BEFORE BREAKFAST.    Dispense:  90 tablet    Refill:  2  . nystatin cream (MYCOSTATIN)    Sig: Apply 1 application topically 2 (two) times daily as needed for dry skin (rash).    Dispense:  30 g    Refill:  1  . fluconazole (DIFLUCAN) 150 MG tablet    Sig: Take 1 tablet (150 mg total) by mouth once a week.    Dispense:  2 tablet    Refill:  0    CMA served as scribe during this visit. History, Physical and Plan performed by medical provider. Documentation and orders reviewed and attested to.  [name], MD

## 2017-10-08 NOTE — Assessment & Plan Note (Signed)
Five pound weight loss since last visit. Encouraged DASH or MIND diet, decrease po intake and increase exercise as tolerated. Needs 7-8 hours of sleep nightly. Avoid trans fats, eat small, frequent meals every 4-5 hours with lean proteins, complex carbs and healthy fats. Minimize simple carbs,

## 2017-10-08 NOTE — Patient Instructions (Addendum)
Witch Hazel Astringent for any rash, hemorrhoids, acne  BRING [place] A COPY OF YOUR ADVANCED DIRECTIVES  Shingrix, the shingles shot, 2 shots over 2-6 months, check with insurance to confirm payment can get at office or pharmacy   Preventive Care 40-64 Years, Female Preventive care refers to lifestyle choices and visits with your health care provider that can promote health and wellness. What does preventive care include?  A yearly physical exam. This is also called an annual well check.  Dental exams once or twice a year.  Routine eye exams. Ask your health care provider how often you should have your eyes checked.  Personal lifestyle choices, including: ? Daily care of your teeth and gums. ? Regular physical activity. ? Eating a healthy diet. ? Avoiding tobacco and drug use. ? Limiting alcohol use. ? Practicing safe sex. ? Taking low-dose aspirin daily starting at age 53. ? Taking vitamin and mineral supplements as recommended by your health care provider. What happens during an annual well check? The services and screenings done by your health care provider during your annual well check will depend on your age, overall health, lifestyle risk factors, and family history of disease. Counseling Your health care provider may ask you questions about your:  Alcohol use.  Tobacco use.  Drug use.  Emotional well-being.  Home and relationship well-being.  Sexual activity.  Eating habits.  Work and work Statistician.  Method of birth control.  Menstrual cycle.  Pregnancy history.  Screening You may have the following tests or measurements:  Height, weight, and BMI.  Blood pressure.  Lipid and cholesterol levels. These may be checked every 5 years, or more frequently if you are over 69 years old.  Skin check.  Lung cancer screening. You may have this screening every year starting at age 39 if you have a 30-pack-year history of smoking and currently smoke or have  quit within the past 15 years.  Fecal occult blood test (FOBT) of the stool. You may have this test every year starting at age 25.  Flexible sigmoidoscopy or colonoscopy. You may have a sigmoidoscopy every 5 years or a colonoscopy every 10 years starting at age 38.  Hepatitis C blood test.  Hepatitis B blood test.  Sexually transmitted disease (STD) testing.  Diabetes screening. This is done by checking your blood sugar (glucose) after you have not eaten for a while (fasting). You may have this done every 1-3 years.  Mammogram. This may be done every 1-2 years. Talk to your health care provider about when you should start having regular mammograms. This may depend on whether you have a family history of breast cancer.  BRCA-related cancer screening. This may be done if you have a family history of breast, ovarian, tubal, or peritoneal cancers.  Pelvic exam and Pap test. This may be done every 3 years starting at age 10. Starting at age 17, this may be done every 5 years if you have a Pap test in combination with an HPV test.  Bone density scan. This is done to screen for osteoporosis. You may have this scan if you are at high risk for osteoporosis.  Discuss your test results, treatment options, and if necessary, the need for more tests with your health care provider. Vaccines Your health care provider may recommend certain vaccines, such as:  Influenza vaccine. This is recommended every year.  Tetanus, diphtheria, and acellular pertussis (Tdap, Td) vaccine. You may need a Td booster every 10 years.  Varicella vaccine.  You may need this if you have not been vaccinated.  Zoster vaccine. You may need this after age 105.  Measles, mumps, and rubella (MMR) vaccine. You may need at least one dose of MMR if you were born in 1957 or later. You may also need a second dose.  Pneumococcal 13-valent conjugate (PCV13) vaccine. You may need this if you have certain conditions and were not  previously vaccinated.  Pneumococcal polysaccharide (PPSV23) vaccine. You may need one or two doses if you smoke cigarettes or if you have certain conditions.  Meningococcal vaccine. You may need this if you have certain conditions.  Hepatitis A vaccine. You may need this if you have certain conditions or if you travel or work in places where you may be exposed to hepatitis A.  Hepatitis B vaccine. You may need this if you have certain conditions or if you travel or work in places where you may be exposed to hepatitis B.  Haemophilus influenzae type b (Hib) vaccine. You may need this if you have certain conditions.  Talk to your health care provider about which screenings and vaccines you need and how often you need them. This information is not intended to replace advice given to you by your health care provider. Make sure you discuss any questions you have with your health care provider. Document Released: 07/30/2015 Document Revised: 03/22/2016 Document Reviewed: 05/04/2015 Elsevier Interactive Patient Education  [employer].

## 2017-10-08 NOTE — Assessment & Plan Note (Addendum)
Patient encouraged to maintain heart healthy diet, regular exercise, adequate sleep. Consider daily probiotics. Take medications as prescribed. Labs today. She has completed Advanced Directives will bring [place] copies

## 2017-10-08 NOTE — Assessment & Plan Note (Signed)
Maintain heart healthy diet, increase exercise, avoid trans fats, consider a krill oil cap daily 

## 2017-10-08 NOTE — Assessment & Plan Note (Signed)
Thin white discharge and a fungal type rash over exterior labia. Fluconazole weekly x 2 and Nystatin cream bid

## 2017-10-08 NOTE — Assessment & Plan Note (Signed)
On Levothyroxine, continue to monitor 

## 2017-10-10 LAB — CYTOLOGY - PAP
Bacterial vaginitis: POSITIVE — AB
Candida vaginitis: NEGATIVE
Diagnosis: NEGATIVE

## 2017-10-11 MED ORDER — METRONIDAZOLE 500 MG PO TABS: 500.0000 mg | ORAL_TABLET | Freq: Two times a day (BID) | ORAL | 0 refills | Status: DC

## 2017-10-11 MED ORDER — FLUCONAZOLE 150 MG PO TABS: 150.0000 mg | ORAL_TABLET | Freq: Once | ORAL | 0 refills | Status: AC

## 2017-10-11 NOTE — Addendum Note (Signed)
Addended by: [name] A on: 10/11/2017 01:40 PM   Modules accepted: Orders

## 2018-06-06 ENCOUNTER — Other Ambulatory Visit: Payer: Self-pay | Admitting: Family Medicine

## 2018-06-06 NOTE — Telephone Encounter (Signed)
Copied from [place] [id number]. Topic: Quick Communication - Rx Refill/Question >> Jun 06, 2018  4:49 PM Sheran Luz wrote: Medication: olmesartan-hydrochlorothiazide (BENICAR HCT) 20-12.5 MG tablet    Patient states she was advised by pharmacy to contact office to [facility], as this [facility] at [facility].    Preferred Pharmacy (with phone number or street name): [id number] [place], [place] - [address] [phone] (Phone) [phone] (Fax)    Agent: Please be advised that RX refills may take up to 3 business days. We ask that you follow-up with your pharmacy.

## 2018-06-07 MED ORDER — OLMESARTAN MEDOXOMIL-HCTZ 20-12.5 MG PO TABS: 1.0000 | ORAL_TABLET | Freq: Every day | ORAL | 2 refills | Status: DC

## 2018-06-07 NOTE — Telephone Encounter (Signed)
Requested Prescriptions  Pending Prescriptions Disp Refills  . olmesartan-hydrochlorothiazide (BENICAR HCT) 20-12.5 MG tablet 90 tablet 2    Sig: Take 1 tablet by mouth daily.     Cardiovascular: ARB + Diuretic Combos Failed - 06/06/2018  7:08 PM      Failed - K in normal range and within 180 days    Potassium  Date Value Ref Range Status  10/08/2017 5.0 3.5 - 5.1 mEq/L Final         Failed - Na in normal range and within 180 days    Sodium  Date Value Ref Range Status  10/08/2017 144 135 - 145 mEq/L Final         Failed - Cr in normal range and within 180 days    Creatinine, Ser  Date Value Ref Range Status  10/08/2017 0.88 0.40 - 1.20 mg/dL Final         Failed - Ca in normal range and within 180 days    Calcium  Date Value Ref Range Status  10/08/2017 10.2 8.4 - 10.5 mg/dL Final         Failed - Valid encounter within last 6 months    Recent Outpatient Visits          8 months ago Preventative health care   [facility] [place], [place]   1 year ago Preventative health care   [facility] [place], [place]   2 years ago Essential hypertension   [facility] [place], MD   2 years ago Sacroiliac dysfunction   [facility] [place], MD   3 years ago Preventative health care   [facility] [place], [place]      Future Appointments            In 4 months [name], MD [facility] [place], [place] - Patient is not pregnant      Passed - Last BP in normal range    BP Readings from Last 1 Encounters:  10/08/17 118/68

## 2018-07-23 ENCOUNTER — Other Ambulatory Visit: Payer: Self-pay | Admitting: Family Medicine

## 2018-07-23 DIAGNOSIS — Z1231 Encounter for screening mammogram for malignant neoplasm of breast: Secondary | ICD-10-CM

## 2018-08-23 ENCOUNTER — Ambulatory Visit
Admission: RE | Admit: 2018-08-23 | Discharge: 2018-08-23 | Disposition: A | Discharge status: Home / Self Care | Payer: 59 | Source: Ambulatory Visit | Attending: Family Medicine | Admitting: Family Medicine

## 2018-08-23 DIAGNOSIS — Z1231 Encounter for screening mammogram for malignant neoplasm of breast: Secondary | ICD-10-CM

## 2018-10-10 ENCOUNTER — Ambulatory Visit (INDEPENDENT_AMBULATORY_CARE_PROVIDER_SITE_OTHER): Payer: 59 | Admitting: Family Medicine

## 2018-10-10 ENCOUNTER — Encounter: Payer: 59 | Admitting: Family Medicine

## 2018-10-10 ENCOUNTER — Other Ambulatory Visit: Payer: Self-pay

## 2018-10-10 DIAGNOSIS — R739 Hyperglycemia, unspecified: Secondary | ICD-10-CM

## 2018-10-10 DIAGNOSIS — I10 Essential (primary) hypertension: Secondary | ICD-10-CM | POA: Diagnosis not present

## 2018-10-10 DIAGNOSIS — E039 Hypothyroidism, unspecified: Secondary | ICD-10-CM

## 2018-10-10 DIAGNOSIS — Z9109 Other allergy status, other than to drugs and biological substances: Secondary | ICD-10-CM | POA: Diagnosis not present

## 2018-10-10 MED ORDER — OLMESARTAN MEDOXOMIL-HCTZ 20-12.5 MG PO TABS: 1.0000 | ORAL_TABLET | Freq: Every day | ORAL | 1 refills | Status: DC

## 2018-10-10 NOTE — Assessment & Plan Note (Addendum)
Has trouble every spring with the pollen. She has started Flonase and that has helped but still some PND and clear phlegm so she is advised to add Cetirizine 10 mg daily prn  She had a cold a few weeks ago with cough, malaise, fatigue, congestion. No fevers or chills and largely resolved

## 2018-10-10 NOTE — Assessment & Plan Note (Signed)
On Levothyroxine, continue to monitor 

## 2018-10-10 NOTE — Assessment & Plan Note (Signed)
She feels well. Refill given for a 90 day supply of her Benicarhct to her [facility].

## 2018-10-10 NOTE — Assessment & Plan Note (Signed)
hgba1c acceptable, minimize simple carbs. Increase exercise as tolerated.  

## 2018-10-12 NOTE — Progress Notes (Signed)
Virtual Visit via Telephone Note  I connected with Cindy Perkins on 10/12/18 at 10:00 AM EDT by telephone and verified that I am speaking with the correct person using two identifiers.   I discussed the limitations, risks, security and privacy concerns of performing an evaluation and management service by telephone and the availability of in person appointments. I also discussed with the patient that there may be a patient responsible charge related to this service. The patient expressed understanding and agreed to proceed. Cindy Perkins, CMA was able to get patient on the phone after trying unsuccessfully to get patient set up on Webex.    History of Present Illness: Patient is at home and we are in office discussing her current state of health. She had a cold ac couple of weeks ago with cough, congestion and fatigue but no fever, chills or myalgias. These symptoms have resolved but now her spring allergies with PND and clear discharge are noted. Her greatest ongoing complaint is fatigue. She continues to work at [employer] and still reports to work. Denies CP/palp/SOB/HA/congestion/fevers/GI or GU c/o. Taking meds as prescribed    Observations/Objective: patient reports good BP, last check in past 2 weeks 120/64. NAD   Assessment and Plan:  Essential hypertension She feels well. Refill given for a 90 day supply of her Benicarhct to her [facility].   Hypothyroidism On Levothyroxine, continue to monitor  Hyperglycemia hgba1c acceptable, minimize simple carbs. Increase exercise as tolerated.  Environmental allergies Has trouble every spring with the pollen. She has started Flonase and that has helped but still some PND and clear phlegm so she is advised to add Cetirizine 10 mg daily prn  She had a cold a few weeks ago with cough, malaise, fatigue, congestion. No fevers or chills and largely resolved    Follow Up Instructions: 3 month f/u    I discussed the assessment  and treatment plan with the patient. The patient was provided an opportunity to ask questions and all were answered. The patient agreed with the plan and demonstrated an understanding of the instructions.   The patient was advised to call back or seek an in-person evaluation if the symptoms worsen or if the condition fails to improve as anticipated.  I provided 22 minutes of non-face-to-face time during this encounter.   [name], MD

## 2018-12-30 ENCOUNTER — Other Ambulatory Visit: Payer: Self-pay | Admitting: Family Medicine

## 2019-01-14 ENCOUNTER — Encounter: Payer: 59 | Admitting: Family Medicine

## 2019-04-21 ENCOUNTER — Other Ambulatory Visit: Payer: Self-pay

## 2019-04-21 ENCOUNTER — Encounter: Payer: Self-pay | Admitting: Family Medicine

## 2019-04-21 ENCOUNTER — Ambulatory Visit (INDEPENDENT_AMBULATORY_CARE_PROVIDER_SITE_OTHER): Payer: 59 | Admitting: Family Medicine

## 2019-04-21 VITALS — BP 142/80 | HR 97 | Temp 98.0°F | Resp 18 | Wt 182.0 lb

## 2019-04-21 DIAGNOSIS — R079 Chest pain, unspecified: Secondary | ICD-10-CM

## 2019-04-21 DIAGNOSIS — Z860100 Personal history of colon polyps, unspecified: Secondary | ICD-10-CM

## 2019-04-21 DIAGNOSIS — I10 Essential (primary) hypertension: Secondary | ICD-10-CM | POA: Diagnosis not present

## 2019-04-21 DIAGNOSIS — I493 Ventricular premature depolarization: Secondary | ICD-10-CM | POA: Insufficient documentation

## 2019-04-21 DIAGNOSIS — Z0000 Encounter for general adult medical examination without abnormal findings: Secondary | ICD-10-CM

## 2019-04-21 DIAGNOSIS — E785 Hyperlipidemia, unspecified: Secondary | ICD-10-CM

## 2019-04-21 DIAGNOSIS — E039 Hypothyroidism, unspecified: Secondary | ICD-10-CM | POA: Diagnosis not present

## 2019-04-21 DIAGNOSIS — R739 Hyperglycemia, unspecified: Secondary | ICD-10-CM

## 2019-04-21 DIAGNOSIS — D369 Benign neoplasm, unspecified site: Secondary | ICD-10-CM

## 2019-04-21 NOTE — Assessment & Plan Note (Signed)
hgba1c acceptable, minimize simple carbs. Increase exercise as tolerated.  

## 2019-04-21 NOTE — Assessment & Plan Note (Addendum)
Patient encouraged to maintain heart healthy diet, regular exercise, adequate sleep. Consider daily probiotics. Take medications as prescribed. Labs ordered and reviewed. Pap and MGM up to date. Needs repeat colonoscopy, referral placed. She declines Shingrix and is getting the flu shot tomorrow at work at [employer] [place].

## 2019-04-21 NOTE — Assessment & Plan Note (Signed)
Encouraged heart healthy diet, increase exercise, avoid trans fats, consider a krill oil cap daily 

## 2019-04-21 NOTE — Patient Instructions (Addendum)
pulse oximeter want numbers in the 90s   Weekly vitals  Multivitamin with minerals selenium, zinc, vitamin c and vitamin d  Preventive Care 19-56 Years Old, Female Preventive care refers to visits with your health care provider and lifestyle choices that can promote health and wellness. This includes:  A yearly physical exam. This may also be called an annual well check.  Regular dental visits and eye exams.  Immunizations.  Screening for certain conditions.  Healthy lifestyle choices, such as eating a healthy diet, getting regular exercise, not using drugs or products that contain nicotine and tobacco, and limiting alcohol use. What can I expect for my preventive care visit? Physical exam Your health care provider will check your:  Height and weight. This may be used to calculate body mass index (BMI), which tells if you are at a healthy weight.  Heart rate and blood pressure.  Skin for abnormal spots. Counseling Your health care provider may ask you questions about your:  Alcohol, tobacco, and drug use.  Emotional well-being.  Home and relationship well-being.  Sexual activity.  Eating habits.  Work and work Statistician.  Method of birth control.  Menstrual cycle.  Pregnancy history. What immunizations do I need?  Influenza (flu) vaccine  This is recommended every year. Tetanus, diphtheria, and pertussis (Tdap) vaccine  You may need a Td booster every 10 years. Varicella (chickenpox) vaccine  You may need this if you have not been vaccinated. Zoster (shingles) vaccine  You may need this after age 71. Measles, mumps, and rubella (MMR) vaccine  You may need at least one dose of MMR if you were born in 1957 or later. You may also need a second dose. Pneumococcal conjugate (PCV13) vaccine  You may need this if you have certain conditions and were not previously vaccinated. Pneumococcal polysaccharide (PPSV23) vaccine  You may need one or two doses if  you smoke cigarettes or if you have certain conditions. Meningococcal conjugate (MenACWY) vaccine  You may need this if you have certain conditions. Hepatitis A vaccine  You may need this if you have certain conditions or if you travel or work in places where you may be exposed to hepatitis A. Hepatitis B vaccine  You may need this if you have certain conditions or if you travel or work in places where you may be exposed to hepatitis B. Haemophilus influenzae type b (Hib) vaccine  You may need this if you have certain conditions. Human papillomavirus (HPV) vaccine  If recommended by your health care provider, you may need three doses over 6 months. You may receive vaccines as individual doses or as more than one vaccine together in one shot (combination vaccines). Talk with your health care provider about the risks and benefits of combination vaccines. What tests do I need? Blood tests  Lipid and cholesterol levels. These may be checked every 5 years, or more frequently if you are over 91 years old.  Hepatitis C test.  Hepatitis B test. Screening  Lung cancer screening. You may have this screening every year starting at age 12 if you have a 30-pack-year history of smoking and currently smoke or have quit within the past 15 years.  Colorectal cancer screening. All adults should have this screening starting at age 69 and continuing until age 32. Your health care provider may recommend screening at age 78 if you are at increased risk. You will have tests every 1-10 years, depending on your results and the type of screening test.  Diabetes  screening. This is done by checking your blood sugar (glucose) after you have not eaten for a while (fasting). You may have this done every 1-3 years.  Mammogram. This may be done every 1-2 years. Talk with your health care provider about when you should start having regular mammograms. This may depend on whether you have a family history of breast  cancer.  BRCA-related cancer screening. This may be done if you have a family history of breast, ovarian, tubal, or peritoneal cancers.  Pelvic exam and Pap test. This may be done every 3 years starting at age 48. Starting at age 3, this may be done every 5 years if you have a Pap test in combination with an HPV test. Other tests  Sexually transmitted disease (STD) testing.  Bone density scan. This is done to screen for osteoporosis. You may have this scan if you are at high risk for osteoporosis. Follow these instructions at home: Eating and drinking  Eat a diet that includes fresh fruits and vegetables, whole grains, lean protein, and low-fat dairy.  Take vitamin and mineral supplements as recommended by your health care provider.  Do not drink alcohol if: ? Your health care provider tells you not to drink. ? You are pregnant, may be pregnant, or are planning to become pregnant.  If you drink alcohol: ? Limit how much you have to 0-1 drink a day. ? Be aware of how much alcohol is in your drink. In the [place] one drink equals one 12 oz bottle of beer (355 mL), one 5 oz glass of wine (148 mL), or one 1 oz glass of hard liquor (44 mL). Lifestyle  Take daily care of your teeth and gums.  Stay active. Exercise for at least 30 minutes on 5 or more days each week.  Do not use any products that contain nicotine or tobacco, such as cigarettes, e-cigarettes, and chewing tobacco. If you need help quitting, ask your health care provider.  If you are sexually active, practice safe sex. Use a condom or other form of birth control (contraception) in order to prevent pregnancy and STIs (sexually transmitted infections).  If told by your health care provider, take low-dose aspirin daily starting at age 28. What's next?  Visit your health care provider once a year for a well check visit.  Ask your health care provider how often you should have your eyes and teeth checked.  Stay up to date  on all vaccines. This information is not intended to replace advice given to you by your health care provider. Make sure you discuss any questions you have with your health care provider. Document Released: 07/30/2015 Document Revised: 03/14/2018 Document Reviewed: 03/14/2018 Elsevier Patient Education  2020 [employer].

## 2019-04-21 NOTE — Progress Notes (Signed)
Patient ID: Cindy Perkins, female   DOB: Nov 14, 1960, 58 y.o.   MRN: [id number]   Subjective:    Patient ID: Cindy Perkins, female    DOB: 18-Dec-1960, 58 y.o.   MRN: [id number]  No chief complaint on file.   HPI Patient is in today for annual preventative exam and follow up on chronic medical concerns including hyperglycemia, hyperlipidemia and hypertension. She just began noting a sense of a flutter in her chest today and EKG confirms a PVC. No associated symptoms and this has not become a pattern. She last her brother she was very closed to last week so she has been under a good deal of stress. She denies any recent febrile illness or hospitalizations. No polyuria or polydipsia. Is working but otherwise stays in as much as possible. Is masking has not been exercising regularly. Denies SOB/HA/congestion/fevers/GI or GU c/o. Taking meds as prescribed  Past Medical History:  Diagnosis Date  . ALLERGIC RHINITIS 03/28/2007  . Allergy   . Cervical cancer screening 04/05/2015  . Chicken pox as a child  . [place], FEMALE 04/03/2008  . Diverticulosis 04/11/2015   By colonoscopy in November 2015  . Hx of colonic polyp 10/08/2017  . Hyperglycemia 03/29/2014  . Hyperlipidemia, mixed 10/06/2016  . HYPERTENSION, SYSTOLIC, BORDERLINE [id number]   on medicine for BP  . HYPOTHYROIDISM 03/28/2007  . Obesity 05/18/2010   Qualifier: Diagnosis of  By: [name] MD, Balinda Quails   . Postmenopausal bleeding 05/03/2009  . Preventative health care 04/11/2015  . Sacroiliac dysfunction 10/04/2015    Past Surgical History:  Procedure Laterality Date  . KNEE SURGERY Left    surgical repair of injury  . TUBAL LIGATION  1995    Family History  Problem Relation Age of Onset  . Dementia Mother   . Hypertension Mother   . Hyperlipidemia Mother   . COPD Mother        smoker but quit for 30 yrs  . Dementia Father   . Diabetes Father        type 2  . Hypertension Father   . Obesity Brother   . Cancer Brother 58       bone  . Heart attack Maternal Grandfather   . Diabetes Paternal Grandfather        type 2  . Obesity Brother   . Pulmonary embolism Brother   . Alzheimer's disease Sister   . COPD Paternal Uncle   . Colon cancer Neg Hx   . Rectal cancer Neg Hx   . Stomach cancer Neg Hx   . Breast cancer Neg Hx     Social History   Socioeconomic History  . Marital status: Married    Spouse name: Not on file  . Number of children: Not on file  . Years of education: Not on file  . Highest education level: Not on file  Occupational History  . Not on file  Social Needs  . Financial resource strain: Not on file  . Food insecurity    Worry: Not on file    Inability: Not on file  . Transportation needs    Medical: Not on file    Non-medical: Not on file  Tobacco Use  . Smoking status: Never Smoker  . Smokeless tobacco: Never Used  Substance and Sexual Activity  . Alcohol use: Yes    Comment: 12 beers weekly  . Drug use: No  . Sexual activity: Yes    Birth control/protection: Post-menopausal  Comment: lives with husband, works for [employer], no dietary restrictions  Lifestyle  . Physical activity    Days per week: Not on file    Minutes per session: Not on file  . Stress: Not on file  Relationships  . Social Herbalist on phone: Not on file    Gets together: Not on file    Attends religious service: Not on file    Active member of club or organization: Not on file    Attends meetings of clubs or organizations: Not on file    Relationship status: Not on file  . Intimate partner violence    Fear of current or ex partner: Not on file    Emotionally abused: Not on file    Physically abused: Not on file    Forced sexual activity: Not on file  Other Topics Concern  . Not on file  Social History Narrative  . Not on file    Outpatient Medications Prior to Visit  Medication Sig Dispense Refill  . Calcium Carbonate-Vitamin D (CALCIUM-VITAMIN D) 500-200 MG-UNIT  per tablet Take 1 tablet by mouth 2 (two) times daily with a meal. Reported on 10/01/2015    . fluconazole (DIFLUCAN) 150 MG tablet Take 1 tablet (150 mg total) by mouth once a week. 2 tablet 0  . ibuprofen (ADVIL,MOTRIN) 200 MG tablet Take 400 mg by mouth every 6 (six) hours as needed.    Marland Kitchen levothyroxine (SYNTHROID) 100 MCG tablet TAKE 1 TABLET (100 MCG TOTAL) BY MOUTH DAILY BEFORE BREAKFAST. 90 tablet 2  . metroNIDAZOLE (FLAGYL) 500 MG tablet Take 1 tablet (500 mg total) by mouth 2 (two) times daily. 14 tablet 0  . Multiple Vitamin (MULTIVITAMIN) capsule Take 1 capsule by mouth daily.      Marland Kitchen nystatin cream (MYCOSTATIN) Apply 1 application topically 2 (two) times daily as needed for dry skin (rash). 30 g 1  . olmesartan-hydrochlorothiazide (BENICAR HCT) 20-12.5 MG tablet Take 1 tablet by mouth daily. 90 tablet 1   No facility-administered medications prior to visit.     Allergies  Allergen Reactions  . Penicillins Swelling    Review of Systems  Constitutional: Positive for malaise/fatigue. Negative for chills and fever.  HENT: Negative for congestion and hearing loss.   Eyes: Negative for discharge.  Respiratory: Negative for sputum production and shortness of breath.   Cardiovascular: Positive for palpitations. Negative for chest pain and leg swelling.  Gastrointestinal: Negative for abdominal pain, blood in stool, constipation, diarrhea, heartburn, nausea and vomiting.  Genitourinary: Negative for dysuria, frequency, hematuria and urgency.  Musculoskeletal: Negative for back pain, falls and myalgias.  Skin: Negative for rash.  Neurological: Negative for dizziness, sensory change, loss of consciousness, weakness and headaches.  Endo/Heme/Allergies: Negative for environmental allergies. Does not bruise/bleed easily.  Psychiatric/Behavioral: Negative for depression and suicidal ideas. The patient is nervous/anxious. The patient does not have insomnia.        Objective:    Physical  Exam Constitutional:      General: She is not in acute distress.    Appearance: She is not diaphoretic.  HENT:     Head: Normocephalic and atraumatic.     Right Ear: External ear normal.     Left Ear: External ear normal.     Nose: Nose normal.     Mouth/Throat:     Pharynx: No oropharyngeal exudate.  Eyes:     General: No scleral icterus.       Right eye:  No discharge.        Left eye: No discharge.     Conjunctiva/sclera: Conjunctivae normal.     Pupils: Pupils are equal, round, and reactive to light.  Neck:     Musculoskeletal: Normal range of motion and neck supple.     Thyroid: No thyromegaly.  Cardiovascular:     Rate and Rhythm: Normal rate and regular rhythm.     Heart sounds: Normal heart sounds. No murmur.  Pulmonary:     Effort: Pulmonary effort is normal. No respiratory distress.     Breath sounds: Normal breath sounds. No wheezing or rales.  Abdominal:     General: Bowel sounds are normal. There is no distension.     Palpations: Abdomen is soft. There is no mass.     Tenderness: There is no abdominal tenderness.  Musculoskeletal: Normal range of motion.        General: No tenderness.  Lymphadenopathy:     Cervical: No cervical adenopathy.  Skin:    General: Skin is warm and dry.     Findings: No rash.  Neurological:     Mental Status: She is alert and oriented to person, place, and time.     Cranial Nerves: No cranial nerve deficit.     Coordination: Coordination normal.     Deep Tendon Reflexes: Reflexes are normal and symmetric. Reflexes normal.     BP (!) 142/80 (BP Location: Left Arm, Patient Position: Sitting, Cuff Size: Normal)   Pulse 97   Temp 98 F (36.7 C) (Temporal)   Resp 18   Wt 182 lb (82.6 kg)   SpO2 98%   BMI 31.24 kg/m  Wt Readings from Last 3 Encounters:  04/21/19 182 lb (82.6 kg)  10/08/17 178 lb (80.7 kg)  10/06/16 182 lb 9.6 oz (82.8 kg)    Diabetic Foot Exam - Simple   No data filed     Lab Results  Component Value  Date   WBC 5.4 10/08/2017   HGB 14.1 10/08/2017   HCT 41.7 10/08/2017   PLT 191.0 10/08/2017   GLUCOSE 102 (H) 10/08/2017   CHOL 191 10/08/2017   TRIG 102.0 10/08/2017   HDL 92.20 10/08/2017   LDLDIRECT 87.7 08/23/2012   LDLCALC 78 10/08/2017   ALT 17 10/08/2017   AST 17 10/08/2017   NA 144 10/08/2017   K 5.0 10/08/2017   CL 104 10/08/2017   CREATININE 0.88 10/08/2017   BUN 17 10/08/2017   CO2 32 10/08/2017   TSH 1.85 10/08/2017   HGBA1C 4.8 10/08/2017    Lab Results  Component Value Date   TSH 1.85 10/08/2017   Lab Results  Component Value Date   WBC 5.4 10/08/2017   HGB 14.1 10/08/2017   HCT 41.7 10/08/2017   MCV 99.8 10/08/2017   PLT 191.0 10/08/2017   Lab Results  Component Value Date   NA 144 10/08/2017   K 5.0 10/08/2017   CO2 32 10/08/2017   GLUCOSE 102 (H) 10/08/2017   BUN 17 10/08/2017   CREATININE 0.88 10/08/2017   BILITOT 0.7 10/08/2017   ALKPHOS 56 10/08/2017   AST 17 10/08/2017   ALT 17 10/08/2017   PROT 7.9 10/08/2017   ALBUMIN 4.3 10/08/2017   CALCIUM 10.2 10/08/2017   GFR 70.87 10/08/2017   Lab Results  Component Value Date   CHOL 191 10/08/2017   Lab Results  Component Value Date   HDL 92.20 10/08/2017   Lab Results  Component Value Date   LDLCALC  78 10/08/2017   Lab Results  Component Value Date   TRIG 102.0 10/08/2017   Lab Results  Component Value Date   CHOLHDL 2 10/08/2017   Lab Results  Component Value Date   HGBA1C 4.8 10/08/2017       Assessment & Plan:   Problem List Items Addressed This Visit    Hypothyroidism    On Levothyroxine, continue to monitor      Essential hypertension    Well controlled, no changes to meds. Encouraged heart healthy diet such as the DASH diet and exercise as tolerated.       Relevant Orders   CBC   Comprehensive metabolic panel   TSH   Hyperglycemia    hgba1c acceptable, minimize simple carbs. Increase exercise as tolerated.      Relevant Orders   Hemoglobin A1c    Preventative health care    Patient encouraged to maintain heart healthy diet, regular exercise, adequate sleep. Consider daily probiotics. Take medications as prescribed. Labs ordered and reviewed. Pap and MGM up to date. Needs repeat colonoscopy, referral placed. She declines Shingrix and is getting the flu shot tomorrow at work at [employer] [place].       Hyperlipidemia    Encouraged heart healthy diet, increase exercise, avoid trans fats, consider a krill oil cap daily      Relevant Orders   Lipid panel   Hx of colonic polyp    She is referred back for surveillance colonoscopy      PVC (premature ventricular contraction)    She just began noting a sense of a flutter in her chest today and EKG confirms a PVC. No associated symptoms and this has not become a pattern. She last her brother she was very closed to last week so she has been under a good deal of stress. Discussed possibility of referral but given the fact that she has only had one episode she declines for now. Will let us know if she is ready to go       Other Visit Diagnoses    Chest pain, unspecified type    -  Primary   Relevant Orders   EKG 12-Lead (Completed)   Adenomatous polyp       Relevant Orders   Ambulatory referral to Gastroenterology      I am having Maecy V. Hodak maintain her multivitamin, calcium-vitamin D, ibuprofen, nystatin cream, fluconazole, metroNIDAZOLE, olmesartan-hydrochlorothiazide, and levothyroxine.  No orders of the defined types were placed in this encounter.    [name], MD

## 2019-04-21 NOTE — Assessment & Plan Note (Signed)
She is referred back for surveillance colonoscopy

## 2019-04-21 NOTE — Assessment & Plan Note (Signed)
She just began noting a sense of a flutter in her chest today and EKG confirms a PVC. No associated symptoms and this has not become a pattern. She last her brother she was very closed to last week so she has been under a good deal of stress. Discussed possibility of referral but given the fact that she has only had one episode she declines for now. Will let us know if she is ready to go

## 2019-04-21 NOTE — Assessment & Plan Note (Signed)
Well controlled, no changes to meds. Encouraged heart healthy diet such as the DASH diet and exercise as tolerated.  °

## 2019-04-21 NOTE — Assessment & Plan Note (Signed)
On Levothyroxine, continue to monitor 

## 2019-04-22 ENCOUNTER — Encounter: Payer: Self-pay | Admitting: Internal Medicine

## 2019-04-22 LAB — HEMOGLOBIN A1C: Hgb A1c MFr Bld: 5.3 % (ref 4.6–6.5)

## 2019-04-22 LAB — LIPID PANEL
Cholesterol: 182 mg/dL (ref 0–200)
HDL: 92.5 mg/dL (ref >39.00)
LDL Cholesterol: 69 mg/dL (ref 0–99)
NonHDL: 89.46
Total CHOL/HDL Ratio: 2
Triglycerides: 101 mg/dL (ref 0.0–149.0)
VLDL: 20.2 mg/dL (ref 0.0–40.0)

## 2019-04-22 LAB — COMPREHENSIVE METABOLIC PANEL
ALT: 20 U/L (ref 0–35)
AST: 19 U/L (ref 0–37)
Albumin: 4.3 g/dL (ref 3.5–5.2)
Alkaline Phosphatase: 64 U/L (ref 39–117)
BUN: 15 mg/dL (ref 6–23)
CO2: 32 mEq/L (ref 19–32)
Calcium: 9.6 mg/dL (ref 8.4–10.5)
Chloride: 100 mEq/L (ref 96–112)
Creatinine, Ser: 1.05 mg/dL (ref 0.40–1.20)
GFR: 54.08 mL/min — ABNORMAL LOW (ref >60.00)
Glucose, Bld: 103 mg/dL — ABNORMAL HIGH (ref 70–99)
Potassium: 4.1 mEq/L (ref 3.5–5.1)
Sodium: 142 mEq/L (ref 135–145)
Total Bilirubin: 0.4 mg/dL (ref 0.2–1.2)
Total Protein: 7.2 g/dL (ref 6.0–8.3)

## 2019-04-22 LAB — CBC
HCT: 38.8 % (ref 36.0–46.0)
Hemoglobin: 13.2 g/dL (ref 12.0–15.0)
MCHC: 33.9 g/dL (ref 30.0–36.0)
MCV: 99 fl (ref 78.0–100.0)
Platelets: 217 10*3/uL (ref 150.0–400.0)
RBC: 3.92 Mil/uL (ref 3.87–5.11)
RDW: 12.7 % (ref 11.5–15.5)
WBC: 6.7 10*3/uL (ref 4.0–10.5)

## 2019-04-22 LAB — TSH: TSH: 1.98 u[IU]/mL (ref 0.35–4.50)

## 2019-05-26 ENCOUNTER — Ambulatory Visit (AMBULATORY_SURGERY_CENTER): Payer: 59 | Admitting: *Deleted

## 2019-05-26 ENCOUNTER — Other Ambulatory Visit: Payer: Self-pay

## 2019-05-26 ENCOUNTER — Encounter: Payer: Self-pay | Admitting: Internal Medicine

## 2019-05-26 VITALS — Temp 97.3°F | Ht 64.0 in | Wt 182.0 lb

## 2019-05-26 DIAGNOSIS — Z86010 Personal history of colonic polyps: Secondary | ICD-10-CM

## 2019-05-26 DIAGNOSIS — Z1159 Encounter for screening for other viral diseases: Secondary | ICD-10-CM

## 2019-05-26 MED ORDER — SUPREP BOWEL PREP KIT 17.5-3.13-1.6 GM/177ML PO SOLN: 1.0000 | Freq: Once | ORAL | 0 refills | Status: AC

## 2019-05-26 NOTE — Progress Notes (Signed)
No egg or soy allergy known to patient  No issues with past sedation with any surgeries  or procedures, no intubation problems  No diet pills per patient No home 02 use per patient  No blood thinners per patient  Pt denies issues with constipation  No A fib or A flutter  EMMI video sent to pt's e mail   COV test 11-18 at 3 pm   Suprep $15 coupon   Due to the COVID-19 pandemic we are asking patients to follow these guidelines. Please only bring one care partner. Please be aware that your care partner may wait in the car in the parking lot or if they feel like they will be too hot to wait in the car, they may wait in the lobby on the 4th floor. All care partners are required to wear a mask the entire time (we do not have any that we can provide them), they need to practice social distancing, and we will do a Covid check for all patient's and care partners when you arrive. Also we will check their temperature and your temperature. If the care partner waits in their car they need to stay in the parking lot the entire time and we will call them on their cell phone when the patient is ready for discharge so they can bring the car to the front of the building. Also all patient's will need to wear a mask into building.

## 2019-06-04 ENCOUNTER — Ambulatory Visit (INDEPENDENT_AMBULATORY_CARE_PROVIDER_SITE_OTHER): Payer: 59

## 2019-06-04 ENCOUNTER — Other Ambulatory Visit: Payer: Self-pay | Admitting: Internal Medicine

## 2019-06-04 DIAGNOSIS — Z1159 Encounter for screening for other viral diseases: Secondary | ICD-10-CM

## 2019-06-05 LAB — SARS CORONAVIRUS 2 (TAT 6-24 HRS): SARS Coronavirus 2: NEGATIVE

## 2019-06-09 ENCOUNTER — Ambulatory Visit (AMBULATORY_SURGERY_CENTER): Payer: 59 | Admitting: Internal Medicine

## 2019-06-09 ENCOUNTER — Encounter: Payer: Self-pay | Admitting: Internal Medicine

## 2019-06-09 ENCOUNTER — Other Ambulatory Visit: Payer: Self-pay

## 2019-06-09 VITALS — BP 119/70 | HR 73 | Temp 98.4°F | Resp 18 | Ht 64.0 in | Wt 182.0 lb

## 2019-06-09 DIAGNOSIS — Z86010 Personal history of colonic polyps: Secondary | ICD-10-CM | POA: Diagnosis not present

## 2019-06-09 DIAGNOSIS — D123 Benign neoplasm of transverse colon: Secondary | ICD-10-CM

## 2019-06-09 DIAGNOSIS — K635 Polyp of colon: Secondary | ICD-10-CM | POA: Diagnosis not present

## 2019-06-09 MED ORDER — SODIUM CHLORIDE 0.9 % IV SOLN: 500.0000 mL | Freq: Once | INTRAVENOUS | Status: DC

## 2019-06-09 NOTE — Op Note (Signed)
[place] Patient Name: Cindy Perkins Procedure Date: 06/09/2019 9:49 AM MRN: [id number] Endoscopist: Jerene Bears , MD Age: 58 Referring MD:  Date of Birth: [birth year] Gender: Female Account #: [id number] Procedure:                Colonoscopy Indications:              Surveillance: Personal history of adenomatous                            polyps on last colonoscopy 5 years ago Medicines:                Monitored Anesthesia Care Procedure:                Pre-Anesthesia Assessment:                           - Prior to the procedure, a History and Physical                            was performed, and patient medications and                            allergies were reviewed. The patient's tolerance of                            previous anesthesia was also reviewed. The risks                            and benefits of the procedure and the sedation                            options and risks were discussed with the patient.                            All questions were answered, and informed consent                            was obtained. Prior Anticoagulants: The patient has                            taken no previous anticoagulant or antiplatelet                            agents. ASA Grade Assessment: II - A patient with                            mild systemic disease. After reviewing the risks                            and benefits, the patient was deemed in                            satisfactory condition to undergo the procedure.  After obtaining informed consent, the colonoscope                            was passed under direct vision. Throughout the                            procedure, the patient's blood pressure, pulse, and                            oxygen saturations were monitored continuously. The                            Colonoscope was introduced through the anus and                            advanced to the cecum,  identified by appendiceal                            orifice and ileocecal valve. The colonoscopy was                            performed without difficulty. The patient tolerated                            the procedure well. The quality of the bowel                            preparation was good. The ileocecal valve,                            appendiceal orifice, and rectum were photographed. Scope In: 9:53:51 AM Scope Out: 10:09:32 AM Scope Withdrawal Time: 0 hours 13 minutes 9 seconds  Total Procedure Duration: 0 hours 15 minutes 41 seconds  Findings:                 The digital rectal exam was normal.                           A 8 mm polyp was found in the hepatic flexure. The                            polyp was sessile. The polyp was removed with a                            cold snare. Resection and retrieval were complete.                           A 6 mm polyp was found in the transverse colon. The                            polyp was sessile. The polyp was removed with a  cold snare. Resection and retrieval were complete.                           A few small-mouthed diverticula were found in the                            sigmoid colon and ascending colon.                           The retroflexed view of the distal rectum and anal                            verge was normal and showed no anal or rectal                            abnormalities. Complications:            No immediate complications. Estimated Blood Loss:     Estimated blood loss was minimal. Impression:               - One 8 mm polyp at the hepatic flexure, removed                            with a cold snare. Resected and retrieved.                           - One 6 mm polyp in the transverse colon, removed                            with a cold snare. Resected and retrieved.                           - Diverticulosis in the sigmoid colon and in the                             ascending colon.                           - The distal rectum and anal verge are normal on                            retroflexion view. Recommendation:           - Patient has a contact number available for                            emergencies. The signs and symptoms of potential                            delayed complications were discussed with the                            patient. Return to normal activities tomorrow.  Written discharge instructions were provided to the                            patient.                           - Resume previous diet.                           - Continue present medications.                           - Await pathology results.                           - Repeat colonoscopy is recommended for                            surveillance. The colonoscopy date will be                            determined after pathology results from today's                            exam become available for review. Jerene Bears, MD 06/09/2019 10:13:27 AM This report has been signed electronically.

## 2019-06-09 NOTE — Patient Instructions (Signed)
Thank you for allowing [place] to care for you today!  Await pathology results by mail, approximately 2 weeks.  Will make recommendation for next colonoscopy at that time.  Resume previous diet and medications today.  Return to normal activities tomorrow.     YOU HAD AN ENDOSCOPIC PROCEDURE TODAY AT [facility]:   Refer to the procedure report that was given to you for any specific questions about what was found during the examination.  If the procedure report does not answer your questions, please call your gastroenterologist to clarify.  If you requested that your care partner not be given the details of your procedure findings, then the procedure report has been included in a sealed envelope for you to review at your convenience later.  YOU SHOULD EXPECT: Some feelings of bloating in the abdomen. Passage of more gas than usual.  Walking can help get rid of the air that was put into your GI tract during the procedure and reduce the bloating. If you had a lower endoscopy (such as a colonoscopy or flexible sigmoidoscopy) you may notice spotting of blood in your stool or on the toilet paper. If you underwent a bowel prep for your procedure, you may not have a normal bowel movement for a few days.  Please Note:  You might notice some irritation and congestion in your nose or some drainage.  This is from the oxygen used during your procedure.  There is no need for concern and it should clear up in a day or so.  SYMPTOMS TO REPORT IMMEDIATELY:   Following lower endoscopy (colonoscopy or flexible sigmoidoscopy):  Excessive amounts of blood in the stool  Significant tenderness or worsening of abdominal pains  Swelling of the abdomen that is new, acute  Fever of 100F or higher  For urgent or emergent issues, a gastroenterologist can be reached at any hour by calling [phone].   DIET:  We do recommend a small meal at first, but then you may proceed to your regular diet.  Drink  plenty of fluids but you should avoid alcoholic beverages for 24 hours.  ACTIVITY:  You should plan to take it easy for the rest of today and you should NOT DRIVE or use heavy machinery until tomorrow (because of the sedation medicines used during the test).    FOLLOW UP: Our staff will call the number listed on your records 48-72 hours following your procedure to check on you and address any questions or concerns that you may have regarding the information given to you following your procedure. If we do not reach you, we will leave a message.  We will attempt to reach you two times.  During this call, we will ask if you have developed any symptoms of COVID 19. If you develop any symptoms (ie: fever, flu-like symptoms, shortness of breath, cough etc.) before then, please call [phone].  If you test positive for Covid 19 in the 2 weeks post procedure, please call and report this information to [place].    If any biopsies were taken you will be contacted by phone or by letter within the next 1-3 weeks.  Please call us at [phone] if you have not heard about the biopsies in 3 weeks.    SIGNATURES/CONFIDENTIALITY: You and/or your care partner have signed paperwork which will be entered into your electronic medical record.  These signatures attest to the fact that that the information above on your After Visit Summary has been reviewed and  is understood.  Full responsibility of the confidentiality of this discharge information lies with you and/or your care-partner.

## 2019-06-09 NOTE — Progress Notes (Signed)
Called to room to assist during endoscopic procedure.  Patient ID and intended procedure confirmed with present staff. Received instructions for my participation in the procedure from the performing physician.  

## 2019-06-09 NOTE — Progress Notes (Signed)
Pt's states no medical or surgical changes since previsit or office visit. Temp by LC. VS by Duncan.

## 2019-06-09 NOTE — Progress Notes (Signed)
Report given to PACU, vss 

## 2019-06-11 ENCOUNTER — Telehealth: Payer: Self-pay | Admitting: *Deleted

## 2019-06-11 NOTE — Telephone Encounter (Signed)
  Follow up Call-  Call back number 06/09/2019  Post procedure Call Back phone  # [phone]  Permission to leave phone message Yes  Some recent data might be hidden     Patient questions:  Do you have a fever, pain , or abdominal swelling? No. Pain Score  0 *  Have you tolerated food without any problems? Yes.    Have you been able to return to your normal activities? Yes.    Do you have any questions about your discharge instructions: Diet   No. Medications  No. Follow up visit  No.  Do you have questions or concerns about your Care? No.  Actions: * If pain score is 4 or above: No action needed, pain <4.  1. Have you developed a fever since your procedure? no  2.   Have you had an respiratory symptoms (SOB or cough) since your procedure? no  3.   Have you tested positive for COVID 19 since your procedure no  4.   Have you had any family members/close contacts diagnosed with the COVID 19 since your procedure?  no   If yes to any of these questions please route to Joylene John, RN and Alphonsa Gin, Therapist, sports.

## 2019-06-16 ENCOUNTER — Encounter: Payer: Self-pay | Admitting: Internal Medicine

## 2019-08-11 ENCOUNTER — Other Ambulatory Visit: Payer: Self-pay | Admitting: Family Medicine

## 2019-08-11 DIAGNOSIS — Z1231 Encounter for screening mammogram for malignant neoplasm of breast: Secondary | ICD-10-CM

## 2019-09-09 ENCOUNTER — Ambulatory Visit
Admission: RE | Admit: 2019-09-09 | Discharge: 2019-09-09 | Disposition: A | Discharge status: Home / Self Care | Payer: 59 | Source: Ambulatory Visit | Attending: Family Medicine | Admitting: Family Medicine

## 2019-09-09 ENCOUNTER — Other Ambulatory Visit: Payer: Self-pay

## 2019-09-09 DIAGNOSIS — Z1231 Encounter for screening mammogram for malignant neoplasm of breast: Secondary | ICD-10-CM

## 2019-10-20 ENCOUNTER — Ambulatory Visit: Payer: 59 | Admitting: Family Medicine

## 2019-10-22 ENCOUNTER — Other Ambulatory Visit: Payer: Self-pay | Admitting: Family Medicine

## 2019-11-13 ENCOUNTER — Ambulatory Visit: Payer: 59 | Admitting: Family Medicine

## 2019-11-13 ENCOUNTER — Other Ambulatory Visit: Payer: Self-pay

## 2019-11-13 VITALS — BP 128/71 | HR 65 | Temp 98.0°F | Resp 12 | Ht 64.0 in | Wt 174.4 lb

## 2019-11-13 DIAGNOSIS — R739 Hyperglycemia, unspecified: Secondary | ICD-10-CM

## 2019-11-13 DIAGNOSIS — L578 Other skin changes due to chronic exposure to nonionizing radiation: Secondary | ICD-10-CM

## 2019-11-13 DIAGNOSIS — E785 Hyperlipidemia, unspecified: Secondary | ICD-10-CM

## 2019-11-13 DIAGNOSIS — I10 Essential (primary) hypertension: Secondary | ICD-10-CM

## 2019-11-13 DIAGNOSIS — E039 Hypothyroidism, unspecified: Secondary | ICD-10-CM | POA: Diagnosis not present

## 2019-11-13 NOTE — Patient Instructions (Addendum)
Magnesium Glycinate 400 mg at bedtime for sleep Insomnia Insomnia is a sleep disorder that makes it difficult to fall asleep or stay asleep. Insomnia can cause fatigue, low energy, difficulty concentrating, mood swings, and poor performance at work or school. There are three different ways to classify insomnia:  Difficulty falling asleep.  Difficulty staying asleep.  Waking up too early in the morning. Any type of insomnia can be long-term (chronic) or short-term (acute). Both are common. Short-term insomnia usually lasts for three months or less. Chronic insomnia occurs at least three times a week for longer than three months. What are the causes? Insomnia may be caused by another condition, situation, or substance, such as:  Anxiety.  Certain medicines.  Gastroesophageal reflux disease (GERD) or other gastrointestinal conditions.  Asthma or other breathing conditions.  Restless legs syndrome, sleep apnea, or other sleep disorders.  Chronic pain.  Menopause.  Stroke.  Abuse of alcohol, tobacco, or illegal drugs.  Mental health conditions, such as depression.  Caffeine.  Neurological disorders, such as Alzheimer's disease.  An overactive thyroid (hyperthyroidism). Sometimes, the cause of insomnia may not be known. What increases the risk? Risk factors for insomnia include:  Gender. Women are affected more often than men.  Age. Insomnia is more common as you get older.  Stress.  Lack of exercise.  Irregular work schedule or working night shifts.  Traveling between different time zones.  Certain medical and mental health conditions. What are the signs or symptoms? If you have insomnia, the main symptom is having trouble falling asleep or having trouble staying asleep. This may lead to other symptoms, such as:  Feeling fatigued or having low energy.  Feeling nervous about going to sleep.  Not feeling rested in the morning.  Having trouble  concentrating.  Feeling irritable, anxious, or depressed. How is this diagnosed? This condition may be diagnosed based on:  Your symptoms and medical history. Your health care provider may ask about: ? Your sleep habits. ? Any medical conditions you have. ? Your mental health.  A physical exam. How is this treated? Treatment for insomnia depends on the cause. Treatment may focus on treating an underlying condition that is causing insomnia. Treatment may also include:  Medicines to help you sleep.  Counseling or therapy.  Lifestyle adjustments to help you sleep better. Follow these instructions at home: Eating and drinking   Limit or avoid alcohol, caffeinated beverages, and cigarettes, especially close to bedtime. These can disrupt your sleep.  Do not eat a large meal or eat spicy foods right before bedtime. This can lead to digestive discomfort that can make it hard for you to sleep. Sleep habits   Keep a sleep diary to help you and your health care provider figure out what could be causing your insomnia. Write down: ? When you sleep. ? When you wake up during the night. ? How well you sleep. ? How rested you feel the next day. ? Any side effects of medicines you are taking. ? What you eat and drink.  Make your bedroom a dark, comfortable place where it is easy to fall asleep. ? Put up shades or blackout curtains to block light from outside. ? Use a white noise machine to block noise. ? Keep the temperature cool.  Limit screen use before bedtime. This includes: ? Watching TV. ? Using your smartphone, tablet, or computer.  Stick to a routine that includes going to bed and waking up at the same times every day and night.   This can help you fall asleep faster. Consider making a quiet activity, such as reading, part of your nighttime routine.  Try to avoid taking naps during the day so that you sleep better at night.  Get out of bed if you are still awake after 15  minutes of trying to sleep. Keep the lights down, but try reading or doing a quiet activity. When you feel sleepy, go back to bed. General instructions  Take over-the-counter and prescription medicines only as told by your health care provider.  Exercise regularly, as told by your health care provider. Avoid exercise starting several hours before bedtime.  Use relaxation techniques to manage stress. Ask your health care provider to suggest some techniques that may work well for you. These may include: ? Breathing exercises. ? Routines to release muscle tension. ? Visualizing peaceful scenes.  Make sure that you drive carefully. Avoid driving if you feel very sleepy.  Keep all follow-up visits as told by your health care provider. This is important. Contact a health care provider if:  You are tired throughout the day.  You have trouble in your daily routine due to sleepiness.  You continue to have sleep problems, or your sleep problems get worse. Get help right away if:  You have serious thoughts about hurting yourself or someone else. If you ever feel like you may hurt yourself or others, or have thoughts about taking your own life, get help right away. You can go to your nearest emergency department or call:  Your local emergency services (911 in the [place]  A suicide crisis helpline, such as the National Suicide Prevention Lifeline at 1-800-273-8255. This is open 24 hours a day. Summary  Insomnia is a sleep disorder that makes it difficult to fall asleep or stay asleep.  Insomnia can be long-term (chronic) or short-term (acute).  Treatment for insomnia depends on the cause. Treatment may focus on treating an underlying condition that is causing insomnia.  Keep a sleep diary to help you and your health care provider figure out what could be causing your insomnia. This information is not intended to replace advice given to you by your health care provider. Make sure you discuss  any questions you have with your health care provider. Document Revised: 06/15/2017 Document Reviewed: 04/12/2017 Elsevier Patient Education  2020 Elsevier Inc.  

## 2019-11-16 DIAGNOSIS — L578 Other skin changes due to chronic exposure to nonionizing radiation: Secondary | ICD-10-CM | POA: Insufficient documentation

## 2019-11-16 NOTE — Assessment & Plan Note (Signed)
Referred to the dermatologist her husband sees for evaluation and surveillance

## 2019-11-16 NOTE — Assessment & Plan Note (Signed)
hgba1c acceptable, minimize simple carbs. Increase exercise as tolerated.  

## 2019-11-16 NOTE — Assessment & Plan Note (Signed)
Well controlled, no changes to meds. Encouraged heart healthy diet such as the DASH diet and exercise as tolerated.  °

## 2019-11-16 NOTE — Assessment & Plan Note (Signed)
On Levothyroxine, continue to monitor 

## 2019-11-16 NOTE — Progress Notes (Signed)
Subjective:    Patient ID: Cindy Perkins, female    DOB: 15-Sep-1960, 59 y.o.   MRN: [id number]  Chief Complaint  Patient presents with  . 6 month follow up    HPI Patient is in today for follow up on chronic medical concerns. No recent febrile illness or hospitalizations. She has been under a good deal of stress since her mother died and her father needs looking after. Her sister also has dementia. She is managing the quarantine well. Denies CP/palp/SOB/HA/congestion/fevers/GI or GU c/o. Taking meds as prescribed. No polyuria or polydipsia.   Past Medical History:  Diagnosis Date  . ALLERGIC RHINITIS 03/28/2007  . Allergy   . Cervical cancer screening 04/05/2015  . Chicken pox as a child  . [place], FEMALE 04/03/2008  . Diverticulosis 04/11/2015   By colonoscopy in November 2015  . Hx of colonic polyp 10/08/2017  . Hyperglycemia 03/29/2014  . Hyperlipidemia, mixed 10/06/2016  . HYPERTENSION, SYSTOLIC, BORDERLINE [id number]   on medicine for BP  . HYPOTHYROIDISM 03/28/2007  . Obesity 05/18/2010   Qualifier: Diagnosis of  By: [name] MD, Balinda Quails   . Postmenopausal bleeding 05/03/2009  . Preventative health care 04/11/2015  . Sacroiliac dysfunction 10/04/2015    Past Surgical History:  Procedure Laterality Date  . COLONOSCOPY    . KNEE SURGERY Left    surgical repair of injury  . POLYPECTOMY    . TUBAL LIGATION  1995    Family History  Problem Relation Age of Onset  . Dementia Mother   . Hypertension Mother   . Hyperlipidemia Mother   . COPD Mother        smoker but quit for 30 yrs  . Dementia Father   . Diabetes Father        type 2  . Hypertension Father   . Obesity Brother   . Cancer Brother 58       bone  . Heart attack Maternal Grandfather   . Diabetes Paternal Grandfather        type 2  . Obesity Brother   . Pulmonary embolism Brother   . Alzheimer's disease Sister   . COPD Paternal Uncle   . Colon cancer Neg Hx   . Rectal cancer Neg Hx   . Stomach  cancer Neg Hx   . Breast cancer Neg Hx   . Colon polyps Neg Hx   . Esophageal cancer Neg Hx     Social History   Socioeconomic History  . Marital status: Married    Spouse name: Not on file  . Number of children: Not on file  . Years of education: Not on file  . Highest education level: Not on file  Occupational History  . Not on file  Tobacco Use  . Smoking status: Never Smoker  . Smokeless tobacco: Never Used  Substance and Sexual Activity  . Alcohol use: Yes    Comment: 12 beers weekly  . Drug use: No  . Sexual activity: Yes    Birth control/protection: Post-menopausal    Comment: lives with husband, works for [employer], no dietary restrictions  Other Topics Concern  . Not on file  Social History Narrative  . Not on file   Social Determinants of Health   Financial Resource Strain:   . Difficulty of Paying Living Expenses:   Food Insecurity:   . Worried About Charity fundraiser in the Last Year:   . [place] in the Last Year:  Transportation Needs:   . Film/video editor (Medical):   Marland Kitchen Lack of Transportation (Non-Medical):   Physical Activity:   . Days of Exercise per Week:   . Minutes of Exercise per Session:   Stress:   . Feeling of Stress :   Social Connections:   . Frequency of Communication with Friends and Family:   . Frequency of Social Gatherings with Friends and Family:   . Attends Religious Services:   . Active Member of Clubs or Organizations:   . Attends Archivist Meetings:   Marland Kitchen Marital Status:   Intimate Partner Violence:   . Fear of Current or Ex-Partner:   . Emotionally Abused:   Marland Kitchen Physically Abused:   . Sexually Abused:     Outpatient Medications Prior to Visit  Medication Sig Dispense Refill  . Calcium Carbonate-Vitamin D (CALCIUM-VITAMIN D) 500-200 MG-UNIT per tablet Take 1 tablet by mouth 2 (two) times daily with a meal. Reported on 10/01/2015    . ibuprofen (ADVIL,MOTRIN) 200 MG tablet Take 400 mg by  mouth every 6 (six) hours as needed.    Marland Kitchen levothyroxine (SYNTHROID) 100 MCG tablet Take 1 tablet (100 mcg total) by mouth daily before breakfast. 90 tablet 0  . Multiple Vitamin (MULTIVITAMIN) capsule Take 1 capsule by mouth daily.      Marland Kitchen olmesartan-hydrochlorothiazide (BENICAR HCT) 20-12.5 MG tablet Take 1 tablet by mouth daily. 90 tablet 1  . fluconazole (DIFLUCAN) 150 MG tablet Take 1 tablet (150 mg total) by mouth once a week. (Patient not taking: Reported on 06/09/2019) 2 tablet 0   No facility-administered medications prior to visit.    Allergies  Allergen Reactions  . Penicillins Swelling    Review of Systems  Constitutional: Negative for fever and malaise/fatigue.  HENT: Negative for congestion.   Eyes: Negative for blurred vision.  Respiratory: Negative for shortness of breath.   Cardiovascular: Negative for chest pain, palpitations and leg swelling.  Gastrointestinal: Negative for abdominal pain, blood in stool and nausea.  Genitourinary: Negative for dysuria and frequency.  Musculoskeletal: Negative for falls.  Skin: Negative for rash.  Neurological: Negative for dizziness, loss of consciousness and headaches.  Endo/Heme/Allergies: Negative for environmental allergies.  Psychiatric/Behavioral: Negative for depression. The patient is not nervous/anxious.        Objective:    Physical Exam Vitals and nursing note reviewed.  Constitutional:      General: She is not in acute distress.    Appearance: She is well-developed.  HENT:     Head: Normocephalic and atraumatic.     Nose: Nose normal.  Eyes:     General:        Right eye: No discharge.        Left eye: No discharge.  Cardiovascular:     Rate and Rhythm: Normal rate and regular rhythm.     Heart sounds: No murmur.  Pulmonary:     Effort: Pulmonary effort is normal.     Breath sounds: Normal breath sounds.  Abdominal:     General: Bowel sounds are normal.     Palpations: Abdomen is soft.     Tenderness:  There is no abdominal tenderness.  Musculoskeletal:     Cervical back: Normal range of motion and neck supple.  Skin:    General: Skin is warm and dry.  Neurological:     Mental Status: She is alert and oriented to person, place, and time.     BP 128/71 (BP Location: Left Arm, Cuff Size:  Normal)   Pulse 65   Temp 98 F (36.7 C) (Temporal)   Resp 12   Ht 5\' 4"  (1.626 m)   Wt 174 lb 6.4 oz (79.1 kg)   SpO2 99%   BMI 29.94 kg/m  Wt Readings from Last 3 Encounters:  11/13/19 174 lb 6.4 oz (79.1 kg)  06/09/19 182 lb (82.6 kg)  05/26/19 182 lb (82.6 kg)    Diabetic Foot Exam - Simple   No data filed     Lab Results  Component Value Date   WBC 6.7 04/21/2019   HGB 13.2 04/21/2019   HCT 38.8 04/21/2019   PLT 217.0 04/21/2019   GLUCOSE 103 (H) 04/21/2019   CHOL 182 04/21/2019   TRIG 101.0 04/21/2019   HDL 92.50 04/21/2019   LDLDIRECT 87.7 08/23/2012   LDLCALC 69 04/21/2019   ALT 20 04/21/2019   AST 19 04/21/2019   NA 142 04/21/2019   K 4.1 04/21/2019   CL 100 04/21/2019   CREATININE 1.05 04/21/2019   BUN 15 04/21/2019   CO2 32 04/21/2019   TSH 1.98 04/21/2019   HGBA1C 5.3 04/21/2019    Lab Results  Component Value Date   TSH 1.98 04/21/2019   Lab Results  Component Value Date   WBC 6.7 04/21/2019   HGB 13.2 04/21/2019   HCT 38.8 04/21/2019   MCV 99.0 04/21/2019   PLT 217.0 04/21/2019   Lab Results  Component Value Date   NA 142 04/21/2019   K 4.1 04/21/2019   CO2 32 04/21/2019   GLUCOSE 103 (H) 04/21/2019   BUN 15 04/21/2019   CREATININE 1.05 04/21/2019   BILITOT 0.4 04/21/2019   ALKPHOS 64 04/21/2019   AST 19 04/21/2019   ALT 20 04/21/2019   PROT 7.2 04/21/2019   ALBUMIN 4.3 04/21/2019   CALCIUM 9.6 04/21/2019   GFR 54.08 (L) 04/21/2019   Lab Results  Component Value Date   CHOL 182 04/21/2019   Lab Results  Component Value Date   HDL 92.50 04/21/2019   Lab Results  Component Value Date   LDLCALC 69 04/21/2019   Lab Results    Component Value Date   TRIG 101.0 04/21/2019   Lab Results  Component Value Date   CHOLHDL 2 04/21/2019   Lab Results  Component Value Date   HGBA1C 5.3 04/21/2019       Assessment & Plan:   Problem List Items Addressed This Visit    Hypothyroidism    On Levothyroxine, continue to monitor      Relevant Orders   TSH   Essential hypertension - Primary    Well controlled, no changes to meds. Encouraged heart healthy diet such as the DASH diet and exercise as tolerated.       Relevant Orders   CBC   Comprehensive metabolic panel   Hyperglycemia    hgba1c acceptable, minimize simple carbs. Increase exercise as tolerated.       Relevant Orders   Hemoglobin A1c   Hyperlipidemia   Relevant Orders   Lipid panel   Sun-damaged skin    Referred to the dermatologist her husband sees for evaluation and surveillance      Relevant Orders   Ambulatory referral to Dermatology      I have discontinued Graciemae V. Schnee's fluconazole. I am also having her maintain her multivitamin, calcium-vitamin D, ibuprofen, olmesartan-hydrochlorothiazide, and levothyroxine.  No orders of the defined types were placed in this encounter.    [name], MD

## 2019-11-18 ENCOUNTER — Ambulatory Visit: Payer: 59 | Admitting: Family Medicine

## 2019-11-24 ENCOUNTER — Other Ambulatory Visit: Payer: Self-pay | Admitting: Family Medicine

## 2020-01-21 ENCOUNTER — Other Ambulatory Visit: Payer: Self-pay | Admitting: Family Medicine

## 2020-01-21 NOTE — Telephone Encounter (Signed)
Rx not refilled. Pt did not have labs during April 2021 visit.

## 2020-01-27 ENCOUNTER — Other Ambulatory Visit: Payer: Self-pay | Admitting: Family Medicine

## 2020-01-28 MED ORDER — LEVOTHYROXINE SODIUM 100 MCG PO TABS: 100.0000 ug | ORAL_TABLET | Freq: Every day | ORAL | 0 refills | Status: DC

## 2020-03-12 ENCOUNTER — Other Ambulatory Visit: Payer: Self-pay | Admitting: Family Medicine

## 2020-03-12 MED ORDER — LEVOTHYROXINE SODIUM 100 MCG PO TABS: 100.0000 ug | ORAL_TABLET | Freq: Every day | ORAL | 1 refills | Status: DC

## 2020-03-12 MED ORDER — OLMESARTAN MEDOXOMIL-HCTZ 20-12.5 MG PO TABS: 1.0000 | ORAL_TABLET | Freq: Every day | ORAL | 1 refills | Status: DC

## 2020-03-12 NOTE — Telephone Encounter (Signed)
Patient has been notified that Rx has been sent into to pharmacy.

## 2020-03-12 NOTE — Telephone Encounter (Signed)
Medication: olmesartan-hydrochlorothiazide (BENICAR HCT) 20-12.5 MG tablet  levothyroxine (SYNTHROID) 100 MCG tablet [[id number]   Has the patient contacted their pharmacy? No. (If no, request that the patient contact the pharmacy for the refill.) (If yes, when and what did the pharmacy advise?)  Preferred Pharmacy (with phone number or street name): [id number] [address]  [place], [place] [address]  Phone:  [phone] Fax:  [phone]  DEA #:  [id number]  Agent: Please be advised that RX refills may take up to 3 business days. We ask that you follow-up with your pharmacy.

## 2020-05-09 ENCOUNTER — Encounter: Payer: Self-pay | Admitting: Family Medicine

## 2020-05-10 NOTE — Telephone Encounter (Signed)
Caller: Cindy Perkins   Patient states she tested positive for covid friday at work  and states she is only having sinus symptoms. Patient would like to know if Dr Charlett Blake recommend for her to take  Please Advise

## 2020-05-10 NOTE — Telephone Encounter (Signed)
Called pt left message with providers instruction and recommendations to management of sx. Encourage to call office back if recommendations or sx worsen

## 2020-05-25 ENCOUNTER — Ambulatory Visit (INDEPENDENT_AMBULATORY_CARE_PROVIDER_SITE_OTHER): Payer: 59 | Admitting: Family Medicine

## 2020-05-25 ENCOUNTER — Encounter: Payer: Self-pay | Admitting: Family Medicine

## 2020-05-25 ENCOUNTER — Other Ambulatory Visit: Payer: Self-pay

## 2020-05-25 VITALS — BP 122/78 | HR 66 | Temp 98.1°F | Ht 64.0 in | Wt 180.0 lb

## 2020-05-25 DIAGNOSIS — M109 Gout, unspecified: Secondary | ICD-10-CM

## 2020-05-25 DIAGNOSIS — R739 Hyperglycemia, unspecified: Secondary | ICD-10-CM

## 2020-05-25 DIAGNOSIS — I10 Essential (primary) hypertension: Secondary | ICD-10-CM | POA: Diagnosis not present

## 2020-05-25 DIAGNOSIS — E039 Hypothyroidism, unspecified: Secondary | ICD-10-CM | POA: Diagnosis not present

## 2020-05-25 DIAGNOSIS — E785 Hyperlipidemia, unspecified: Secondary | ICD-10-CM | POA: Diagnosis not present

## 2020-05-25 DIAGNOSIS — Z0000 Encounter for general adult medical examination without abnormal findings: Secondary | ICD-10-CM

## 2020-05-25 LAB — CBC
HCT: 39.3 % (ref 36.0–46.0)
Hemoglobin: 13.3 g/dL (ref 12.0–15.0)
MCHC: 33.8 g/dL (ref 30.0–36.0)
MCV: 98.1 fl (ref 78.0–100.0)
Platelets: 186 10*3/uL (ref 150.0–400.0)
RBC: 4 Mil/uL (ref 3.87–5.11)
RDW: 13.3 % (ref 11.5–15.5)
WBC: 5.5 10*3/uL (ref 4.0–10.5)

## 2020-05-25 LAB — COMPREHENSIVE METABOLIC PANEL
ALT: 19 U/L (ref 0–35)
AST: 17 U/L (ref 0–37)
Albumin: 4.4 g/dL (ref 3.5–5.2)
Alkaline Phosphatase: 53 U/L (ref 39–117)
BUN: 13 mg/dL (ref 6–23)
CO2: 31 mEq/L (ref 19–32)
Calcium: 9.6 mg/dL (ref 8.4–10.5)
Chloride: 101 mEq/L (ref 96–112)
Creatinine, Ser: 0.86 mg/dL (ref 0.40–1.20)
GFR: 74.82 mL/min (ref >60.00)
Glucose, Bld: 88 mg/dL (ref 70–99)
Potassium: 4.7 mEq/L (ref 3.5–5.1)
Sodium: 141 mEq/L (ref 135–145)
Total Bilirubin: 0.6 mg/dL (ref 0.2–1.2)
Total Protein: 7.2 g/dL (ref 6.0–8.3)

## 2020-05-25 LAB — LIPID PANEL
Cholesterol: 189 mg/dL (ref 0–200)
HDL: 85 mg/dL (ref >39.00)
LDL Cholesterol: 84 mg/dL (ref 0–99)
NonHDL: 103.63
Total CHOL/HDL Ratio: 2
Triglycerides: 98 mg/dL (ref 0.0–149.0)
VLDL: 19.6 mg/dL (ref 0.0–40.0)

## 2020-05-25 LAB — TSH: TSH: 2.05 u[IU]/mL (ref 0.35–4.50)

## 2020-05-25 LAB — HEMOGLOBIN A1C: Hgb A1c MFr Bld: 5.5 % (ref 4.6–6.5)

## 2020-05-25 LAB — URIC ACID: Uric Acid, Serum: 8.8 mg/dL — ABNORMAL HIGH (ref 2.4–7.0)

## 2020-05-25 NOTE — Assessment & Plan Note (Signed)
Was seen at specialty care during flare. Check uric acid today and encouraged to hydrate well and report concerns.

## 2020-05-25 NOTE — Assessment & Plan Note (Signed)
Well controlled, no changes to meds. Encouraged heart healthy diet such as the DASH diet and exercise as tolerated.  °

## 2020-05-25 NOTE — Assessment & Plan Note (Signed)
hgba1c acceptable, minimize simple carbs. Increase exercise as tolerated.  

## 2020-05-25 NOTE — Progress Notes (Signed)
Subjective:    Patient ID: Cindy Perkins, female    DOB: 1961-04-27, 59 y.o.   MRN: [id number]  Chief Complaint  Patient presents with  . Annual Exam    HPI Patient is in today for annual preventative exam and follow up on chronic medical concerns. No recent febrile illness or hospitalizations. She has not been as active as she would have liked. Tolerated her COVID shots well. Tries to maintain a heart healthy diet on a good day. No acute concerns today. Denies CP/palp/SOB/HA/congestion/fevers/GI or GU c/o. Taking meds as prescribed. She had a very bad gout flare in her right foot with significant pain and swelling recently but that has resolved.   Past Medical History:  Diagnosis Date  . ALLERGIC RHINITIS 03/28/2007  . Allergy   . Cervical cancer screening 04/05/2015  . Chicken pox as a child  . [name], FEMALE 04/03/2008  . Diverticulosis 04/11/2015   By colonoscopy in November 2015  . Hx of colonic polyp 10/08/2017  . Hyperglycemia 03/29/2014  . Hyperlipidemia, mixed 10/06/2016  . HYPERTENSION, SYSTOLIC, BORDERLINE 11/02/3788   on medicine for BP  . HYPOTHYROIDISM 03/28/2007  . Obesity 05/18/2010   Qualifier: Diagnosis of  By: [name] MD, Balinda Quails   . Postmenopausal bleeding 05/03/2009  . Preventative health care 04/11/2015  . Sacroiliac dysfunction 10/04/2015    Past Surgical History:  Procedure Laterality Date  . COLONOSCOPY    . KNEE SURGERY Left    surgical repair of injury  . POLYPECTOMY    . TUBAL LIGATION  1995    Family History  Problem Relation Age of Onset  . Dementia Mother   . Hypertension Mother   . Hyperlipidemia Mother   . COPD Mother        smoker but quit for 30 yrs  . Dementia Father        mild  . Diabetes Father        type 2  . Hypertension Father   . Obesity Brother   . Cancer Brother 58       bone  . Heart attack Maternal Grandfather   . Diabetes Paternal Grandfather        type 2  . Obesity Brother   . Pulmonary embolism Brother     . Alzheimer's disease Sister   . COPD Paternal Uncle   . Colon cancer Neg Hx   . Rectal cancer Neg Hx   . Stomach cancer Neg Hx   . Breast cancer Neg Hx   . Colon polyps Neg Hx   . Esophageal cancer Neg Hx     Social History   Socioeconomic History  . Marital status: Married    Spouse name: Not on file  . Number of children: Not on file  . Years of education: Not on file  . Highest education level: Not on file  Occupational History  . Not on file  Tobacco Use  . Smoking status: Never Smoker  . Smokeless tobacco: Never Used  Substance and Sexual Activity  . Alcohol use: Yes    Comment: 12 beers weekly  . Drug use: No  . Sexual activity: Yes    Birth control/protection: Post-menopausal    Comment: lives with husband, works for [employer], no dietary restrictions  Other Topics Concern  . Not on file  Social History Narrative  . Not on file   Social Determinants of Health   Financial Resource Strain:   . Difficulty of Paying Living Expenses:  Not on file  Food Insecurity:   . Worried About Charity fundraiser in the Last Year: Not on file  . Ran Out of Food in the Last Year: Not on file  Transportation Needs:   . Lack of Transportation (Medical): Not on file  . Lack of Transportation (Non-Medical): Not on file  Physical Activity:   . Days of Exercise per Week: Not on file  . Minutes of Exercise per Session: Not on file  Stress:   . Feeling of Stress : Not on file  Social Connections:   . Frequency of Communication with Friends and Family: Not on file  . Frequency of Social Gatherings with Friends and Family: Not on file  . Attends Religious Services: Not on file  . Active Member of Clubs or Organizations: Not on file  . Attends Archivist Meetings: Not on file  . Marital Status: Not on file  Intimate Partner Violence:   . Fear of Current or Ex-Partner: Not on file  . Emotionally Abused: Not on file  . Physically Abused: Not on file  . Sexually  Abused: Not on file    Outpatient Medications Prior to Visit  Medication Sig Dispense Refill  . Calcium Carbonate-Vitamin D (CALCIUM-VITAMIN D) 500-200 MG-UNIT per tablet Take 1 tablet by mouth 2 (two) times daily with a meal. Reported on 10/01/2015    . ibuprofen (ADVIL,MOTRIN) 200 MG tablet Take 400 mg by mouth every 6 (six) hours as needed.    Marland Kitchen levothyroxine (SYNTHROID) 100 MCG tablet Take 1 tablet (100 mcg total) by mouth daily before breakfast. 90 tablet 1  . Multiple Vitamin (MULTIVITAMIN) capsule Take 1 capsule by mouth daily.      Marland Kitchen olmesartan-hydrochlorothiazide (BENICAR HCT) 20-12.5 MG tablet Take 1 tablet by mouth daily. 90 tablet 1   No facility-administered medications prior to visit.    Allergies  Allergen Reactions  . Penicillins Swelling    Review of Systems  Constitutional: Negative for fever and malaise/fatigue.  HENT: Negative for congestion.   Eyes: Negative for blurred vision.  Respiratory: Negative for shortness of breath.   Cardiovascular: Negative for chest pain, palpitations and leg swelling.  Gastrointestinal: Negative for abdominal pain, blood in stool and nausea.  Genitourinary: Negative for dysuria and frequency.  Musculoskeletal: Positive for joint pain. Negative for falls.  Skin: Negative for rash.  Neurological: Negative for dizziness, loss of consciousness and headaches.  Endo/Heme/Allergies: Negative for environmental allergies.  Psychiatric/Behavioral: Negative for depression. The patient is not nervous/anxious.        Objective:    Physical Exam Vitals reviewed.  Constitutional:      General: She is not in acute distress.    Appearance: She is well-developed.  HENT:     Head: Normocephalic and atraumatic.  Eyes:     Conjunctiva/sclera: Conjunctivae normal.  Neck:     Thyroid: No thyromegaly.  Cardiovascular:     Rate and Rhythm: Normal rate and regular rhythm.     Heart sounds: Normal heart sounds. No murmur heard.   Pulmonary:       Effort: Pulmonary effort is normal. No respiratory distress.     Breath sounds: Normal breath sounds.  Abdominal:     General: Bowel sounds are normal. There is no distension.     Palpations: Abdomen is soft. There is no mass.     Tenderness: There is no abdominal tenderness.  Musculoskeletal:     Cervical back: Neck supple.  Lymphadenopathy:     Cervical:  No cervical adenopathy.  Skin:    General: Skin is warm and dry.  Neurological:     Mental Status: She is alert and oriented to person, place, and time.  Psychiatric:        Behavior: Behavior normal.     BP 122/78   Pulse 66   Temp 98.1 F (36.7 C)   Ht 5\' 4"  (1.626 m)   Wt 180 lb (81.6 kg)   BMI 30.90 kg/m  Wt Readings from Last 3 Encounters:  05/25/20 180 lb (81.6 kg)  11/13/19 174 lb 6.4 oz (79.1 kg)  06/09/19 182 lb (82.6 kg)    Diabetic Foot Exam - Simple   No data filed     Lab Results  Component Value Date   WBC 6.7 04/21/2019   HGB 13.2 04/21/2019   HCT 38.8 04/21/2019   PLT 217.0 04/21/2019   GLUCOSE 103 (H) 04/21/2019   CHOL 182 04/21/2019   TRIG 101.0 04/21/2019   HDL 92.50 04/21/2019   LDLDIRECT 87.7 08/23/2012   LDLCALC 69 04/21/2019   ALT 20 04/21/2019   AST 19 04/21/2019   NA 142 04/21/2019   K 4.1 04/21/2019   CL 100 04/21/2019   CREATININE 1.05 04/21/2019   BUN 15 04/21/2019   CO2 32 04/21/2019   TSH 1.98 04/21/2019   HGBA1C 5.3 04/21/2019    Lab Results  Component Value Date   TSH 1.98 04/21/2019   Lab Results  Component Value Date   WBC 6.7 04/21/2019   HGB 13.2 04/21/2019   HCT 38.8 04/21/2019   MCV 99.0 04/21/2019   PLT 217.0 04/21/2019   Lab Results  Component Value Date   NA 142 04/21/2019   K 4.1 04/21/2019   CO2 32 04/21/2019   GLUCOSE 103 (H) 04/21/2019   BUN 15 04/21/2019   CREATININE 1.05 04/21/2019   BILITOT 0.4 04/21/2019   ALKPHOS 64 04/21/2019   AST 19 04/21/2019   ALT 20 04/21/2019   PROT 7.2 04/21/2019   ALBUMIN 4.3 04/21/2019   CALCIUM  9.6 04/21/2019   GFR 54.08 (L) 04/21/2019   Lab Results  Component Value Date   CHOL 182 04/21/2019   Lab Results  Component Value Date   HDL 92.50 04/21/2019   Lab Results  Component Value Date   LDLCALC 69 04/21/2019   Lab Results  Component Value Date   TRIG 101.0 04/21/2019   Lab Results  Component Value Date   CHOLHDL 2 04/21/2019   Lab Results  Component Value Date   HGBA1C 5.3 04/21/2019       Assessment & Plan:   Problem List Items Addressed This Visit    Hypothyroidism    On Levothyroxine, continue to monitor      Relevant Orders   TSH   Essential hypertension    Well controlled, no changes to meds. Encouraged heart healthy diet such as the DASH diet and exercise as tolerated.       Relevant Orders   CBC   Comprehensive metabolic panel   TSH   Hyperglycemia    hgba1c acceptable, minimize simple carbs. Increase exercise as tolerated.       Relevant Orders   Hemoglobin A1c   Preventative health care    Patient encouraged to maintain heart healthy diet, regular exercise, adequate sleep. Consider daily probiotics. Take medications as prescribed. Labs ordered and reviewed. MGM don Feb 2021 due next year. Colonoscopy 05/2019 due in 2025. Pap 2019 due in 2022, encouraged to take Shingrix and  Covid booster in a few months.Flu shot 05/18/20      Hyperlipidemia    Encouraged heart healthy diet, increase exercise, avoid trans fats, consider a krill oil cap daily      Relevant Orders   Lipid panel   Gout of right foot - Primary    Was seen at specialty care during flare. Check uric acid today and encouraged to hydrate well and report concerns.       Relevant Orders   Uric acid      I am having Jaclynn V. Sayler maintain her multivitamin, calcium-vitamin D, ibuprofen, levothyroxine, and olmesartan-hydrochlorothiazide.  No orders of the defined types were placed in this encounter.    [name], MD

## 2020-05-25 NOTE — Patient Instructions (Signed)
Shingrix is the new shingles shot 2 shots over 2-6 months, confirm coverage with insurance and where it is cheaper to get it. Can make a nurse appt here to get it if you like. Document.  Preventive Care 19-57 Years Old, Female Preventive care refers to visits with your health care provider and lifestyle choices that can promote health and wellness. This includes:  A yearly physical exam. This may also be called an annual well check.  Regular dental visits and eye exams.  Immunizations.  Screening for certain conditions.  Healthy lifestyle choices, such as eating a healthy diet, getting regular exercise, not using drugs or products that contain nicotine and tobacco, and limiting alcohol use. What can I expect for my preventive care visit? Physical exam Your health care provider will check your:  Height and weight. This may be used to calculate body mass index (BMI), which tells if you are at a healthy weight.  Heart rate and blood pressure.  Skin for abnormal spots. Counseling Your health care provider may ask you questions about your:  Alcohol, tobacco, and drug use.  Emotional well-being.  Home and relationship well-being.  Sexual activity.  Eating habits.  Work and work Statistician.  Method of birth control.  Menstrual cycle.  Pregnancy history. What immunizations do I need?  Influenza (flu) vaccine  This is recommended every year. Tetanus, diphtheria, and pertussis (Tdap) vaccine  You may need a Td booster every 10 years. Varicella (chickenpox) vaccine  You may need this if you have not been vaccinated. Zoster (shingles) vaccine  You may need this after age 106. Measles, mumps, and rubella (MMR) vaccine  You may need at least one dose of MMR if you were born in 1957 or later. You may also need a second dose. Pneumococcal conjugate (PCV13) vaccine  You may need this if you have certain conditions and were not previously vaccinated. Pneumococcal  polysaccharide (PPSV23) vaccine  You may need one or two doses if you smoke cigarettes or if you have certain conditions. Meningococcal conjugate (MenACWY) vaccine  You may need this if you have certain conditions. Hepatitis A vaccine  You may need this if you have certain conditions or if you travel or work in places where you may be exposed to hepatitis A. Hepatitis B vaccine  You may need this if you have certain conditions or if you travel or work in places where you may be exposed to hepatitis B. Haemophilus influenzae type b (Hib) vaccine  You may need this if you have certain conditions. Human papillomavirus (HPV) vaccine  If recommended by your health care provider, you may need three doses over 6 months. You may receive vaccines as individual doses or as more than one vaccine together in one shot (combination vaccines). Talk with your health care provider about the risks and benefits of combination vaccines. What tests do I need? Blood tests  Lipid and cholesterol levels. These may be checked every 5 years, or more frequently if you are over 63 years old.  Hepatitis C test.  Hepatitis B test. Screening  Lung cancer screening. You may have this screening every year starting at age 28 if you have a 30-pack-year history of smoking and currently smoke or have quit within the past 15 years.  Colorectal cancer screening. All adults should have this screening starting at age 45 and continuing until age 28. Your health care provider may recommend screening at age 3 if you are at increased risk. You will have tests every  1-10 years, depending on your results and the type of screening test.  Diabetes screening. This is done by checking your blood sugar (glucose) after you have not eaten for a while (fasting). You may have this done every 1-3 years.  Mammogram. This may be done every 1-2 years. Talk with your health care provider about when you should start having regular  mammograms. This may depend on whether you have a family history of breast cancer.  BRCA-related cancer screening. This may be done if you have a family history of breast, ovarian, tubal, or peritoneal cancers.  Pelvic exam and Pap test. This may be done every 3 years starting at age 86. Starting at age 66, this may be done every 5 years if you have a Pap test in combination with an HPV test. Other tests  Sexually transmitted disease (STD) testing.  Bone density scan. This is done to screen for osteoporosis. You may have this scan if you are at high risk for osteoporosis. Follow these instructions at home: Eating and drinking  Eat a diet that includes fresh fruits and vegetables, whole grains, lean protein, and low-fat dairy.  Take vitamin and mineral supplements as recommended by your health care provider.  Do not drink alcohol if: ? Your health care provider tells you not to drink. ? You are pregnant, may be pregnant, or are planning to become pregnant.  If you drink alcohol: ? Limit how much you have to 0-1 drink a day. ? Be aware of how much alcohol is in your drink. In the [place] one drink equals one 12 oz bottle of beer (355 mL), one 5 oz glass of wine (148 mL), or one 1 oz glass of hard liquor (44 mL). Lifestyle  Take daily care of your teeth and gums.  Stay active. Exercise for at least 30 minutes on 5 or more days each week.  Do not use any products that contain nicotine or tobacco, such as cigarettes, e-cigarettes, and chewing tobacco. If you need help quitting, ask your health care provider.  If you are sexually active, practice safe sex. Use a condom or other form of birth control (contraception) in order to prevent pregnancy and STIs (sexually transmitted infections).  If told by your health care provider, take low-dose aspirin daily starting at age 47. What's next?  Visit your health care provider once a year for a well check visit.  Ask your health care provider  how often you should have your eyes and teeth checked.  Stay up to date on all vaccines. This information is not intended to replace advice given to you by your health care provider. Make sure you discuss any questions you have with your health care provider. Document Revised: 03/14/2018 Document Reviewed: 03/14/2018 Elsevier Patient Education  2020 [employer].

## 2020-05-25 NOTE — Assessment & Plan Note (Signed)
Encouraged heart healthy diet, increase exercise, avoid trans fats, consider a krill oil cap daily 

## 2020-05-25 NOTE — Assessment & Plan Note (Signed)
Patient encouraged to maintain heart healthy diet, regular exercise, adequate sleep. Consider daily probiotics. Take medications as prescribed. Labs ordered and reviewed. MGM don Feb 2021 due next year. Colonoscopy 05/2019 due in 2025. Pap 2019 due in 2022, encouraged to take Shingrix and Covid booster in a few months.Flu shot 05/18/20

## 2020-05-25 NOTE — Assessment & Plan Note (Signed)
On Levothyroxine, continue to monitor 

## 2020-05-26 NOTE — Telephone Encounter (Addendum)
-----   Message from [name], MD sent at 05/25/2020  1:36 PM EST ----- Notify labs good except uric acid level (gout marker) is up. Recommend she start on Allopurinol 100 mg daily to help bring that down. Disp #30 with 5 rf  or #90 or 1 hydrate well and repeat cmp and uric acid levels in 3 months if she is willing.     05/26/2020 Pt notified of providers recommendations had a question of diet change before medication could be started. Pt reports she's aware of the diet changes to help better manage uric acid and would like to start there first. Is that okay?

## 2020-05-27 NOTE — Progress Notes (Signed)
Provider okayed the hold off of medication and dietary focus for now in another chart message. Pt will notify office if flair up occurs

## 2020-08-03 ENCOUNTER — Telehealth: Payer: Self-pay | Admitting: Family Medicine

## 2020-08-03 NOTE — Telephone Encounter (Signed)
Patient would like an antibiotic call in for her sore throat she state she has done all she can nothing is working  Please advice

## 2020-08-03 NOTE — Telephone Encounter (Signed)
Needs virtual visit please.  

## 2020-09-04 ENCOUNTER — Other Ambulatory Visit: Payer: Self-pay | Admitting: Family Medicine

## 2020-09-09 ENCOUNTER — Other Ambulatory Visit: Payer: Self-pay | Admitting: Family Medicine

## 2020-09-09 DIAGNOSIS — Z1231 Encounter for screening mammogram for malignant neoplasm of breast: Secondary | ICD-10-CM

## 2020-11-01 ENCOUNTER — Ambulatory Visit
Admission: RE | Admit: 2020-11-01 | Discharge: 2020-11-01 | Disposition: A | Discharge status: Home / Self Care | Payer: 59 | Source: Ambulatory Visit | Attending: Family Medicine | Admitting: Family Medicine

## 2020-11-01 ENCOUNTER — Other Ambulatory Visit: Payer: Self-pay

## 2020-11-01 DIAGNOSIS — Z1231 Encounter for screening mammogram for malignant neoplasm of breast: Secondary | ICD-10-CM

## 2020-11-22 ENCOUNTER — Ambulatory Visit: Payer: 59 | Admitting: Family Medicine

## 2020-12-20 ENCOUNTER — Other Ambulatory Visit: Payer: Self-pay | Admitting: Family Medicine

## 2021-03-01 ENCOUNTER — Ambulatory Visit: Payer: 59 | Admitting: Family Medicine

## 2021-03-01 ENCOUNTER — Encounter: Payer: Self-pay | Admitting: Family Medicine

## 2021-03-01 ENCOUNTER — Other Ambulatory Visit: Payer: Self-pay

## 2021-03-01 VITALS — BP 106/68 | HR 67 | Temp 98.0°F | Resp 16 | Wt 175.8 lb

## 2021-03-01 DIAGNOSIS — R739 Hyperglycemia, unspecified: Secondary | ICD-10-CM | POA: Diagnosis not present

## 2021-03-01 DIAGNOSIS — I10 Essential (primary) hypertension: Secondary | ICD-10-CM | POA: Diagnosis not present

## 2021-03-01 DIAGNOSIS — E785 Hyperlipidemia, unspecified: Secondary | ICD-10-CM

## 2021-03-01 DIAGNOSIS — M109 Gout, unspecified: Secondary | ICD-10-CM

## 2021-03-01 DIAGNOSIS — Z0000 Encounter for general adult medical examination without abnormal findings: Secondary | ICD-10-CM

## 2021-03-01 DIAGNOSIS — E6609 Other obesity due to excess calories: Secondary | ICD-10-CM

## 2021-03-01 DIAGNOSIS — E039 Hypothyroidism, unspecified: Secondary | ICD-10-CM | POA: Diagnosis not present

## 2021-03-01 NOTE — Patient Instructions (Addendum)
Melatonin 5-10 mg and L Tryptophan for sleep Encouraged good sleep hygiene such as dark, quiet room. No blue/green glowing lights such as computer screens in bedroom. No alcohol or stimulants in evening. Cut down on caffeine as able. Regular exercise is helpful but not just prior to bed time.    Paxlovid is the new COVID medication we can give you if you get COVID so make sure you test if you have symptoms because we have to treat by day 5 of symptoms for it to be effective. If you are positive let us know so we can treat. If a home test is negative and your symptoms are persistent get a PCR test. Can check testing locations at [email or web address] If you are positive we will make an appointment with [place] and we will send in Paxlovid if you would like it. Check with your pharmacy before we meet to confirm they have it in stock, if they do not then we can get the prescription at the [facility]

## 2021-03-01 NOTE — Progress Notes (Signed)
Patient ID: Cindy Perkins, female    DOB: 1960/09/21  Age: 60 y.o. MRN: [id number]    Subjective:  Subjective  HPI Blima V Pines presents for office visit today for follow up on htn and hypothyroidism. She states that she is doing great and has no recent hospitalizations or ER visits to report. Denies CP/palp/SOB/HA/congestion/fevers/GI or GU c/o. Taking meds as prescribed. She states that she has started taking 5 mg of melatonin whenever she trouble with sleep. She endorses having a pulse oximeter.  Review of Systems  Constitutional:  Negative for chills, fatigue and fever.  HENT:  Negative for congestion, rhinorrhea, sinus pressure, sinus pain and sore throat.   Eyes:  Negative for pain.  Respiratory:  Negative for cough and shortness of breath.   Cardiovascular:  Negative for chest pain, palpitations and leg swelling.  Gastrointestinal:  Negative for abdominal pain, blood in stool, diarrhea, nausea and vomiting.  Genitourinary:  Negative for decreased urine volume, flank pain, frequency, vaginal bleeding and vaginal discharge.  Musculoskeletal:  Negative for back pain.  Neurological:  Negative for headaches.   History Past Medical History:  Diagnosis Date   ALLERGIC RHINITIS 03/28/2007   Allergy    Cervical cancer screening 04/05/2015   Chicken pox as a child   [place], FEMALE 04/03/2008   Diverticulosis 04/11/2015   By colonoscopy in November 2015   Hx of colonic polyp 10/08/2017   Hyperglycemia 03/29/2014   Hyperlipidemia, mixed 10/06/2016   HYPERTENSION, SYSTOLIC, BORDERLINE [id number]   on medicine for BP   HYPOTHYROIDISM 03/28/2007   Obesity 05/18/2010   Qualifier: Diagnosis of  By: [name] MD, [name]    Postmenopausal bleeding 05/03/2009   Preventative health care 04/11/2015   Sacroiliac dysfunction 10/04/2015    She has a past surgical history that includes Tubal ligation (1995); Knee surgery (Left); Colonoscopy; and Polypectomy.   Her family history includes  Alzheimer's disease in her sister; COPD in her mother and paternal uncle; Cancer (age of onset: 77) in her brother; Cancer (age of onset: 80) in her father; Dementia in her father and mother; Diabetes in her father and paternal grandfather; Heart attack in her maternal grandfather; Hyperlipidemia in her mother; Hypertension in her father and mother; Obesity in her brother and brother; Pulmonary embolism in her brother.She reports that she has never smoked. She has never used smokeless tobacco. She reports current alcohol use. She reports that she does not use drugs.  Current Outpatient Medications on File Prior to Visit  Medication Sig Dispense Refill   Calcium Carbonate-Vitamin D (CALCIUM-VITAMIN D) 500-200 MG-UNIT per tablet Take 1 tablet by mouth 2 (two) times daily with a meal. Reported on 10/01/2015     ibuprofen (ADVIL,MOTRIN) 200 MG tablet Take 400 mg by mouth every 6 (six) hours as needed.     levothyroxine (SYNTHROID) 100 MCG tablet TAKE 1 TABLET BY MOUTH DAILY BEFORE BREAKFAST. 90 tablet 1   Multiple Vitamin (MULTIVITAMIN) capsule Take 1 capsule by mouth daily.       olmesartan-hydrochlorothiazide (BENICAR HCT) 20-12.5 MG tablet TAKE 1 TABLET BY MOUTH EVERY DAY 90 tablet 1   No current facility-administered medications on file prior to visit.     Objective:  Objective  Physical Exam Constitutional:      General: She is not in acute distress.    Appearance: Normal appearance. She is not ill-appearing, toxic-appearing or diaphoretic.  HENT:     Head: Normocephalic and atraumatic.     Right Ear: Tympanic membrane, ear canal  and external ear normal.     Left Ear: Tympanic membrane, ear canal and external ear normal.     Nose: Nose normal. No congestion or rhinorrhea.     Mouth/Throat:     Pharynx: No oropharyngeal exudate.  Eyes:     General: No scleral icterus.       Right eye: No discharge.        Left eye: No discharge.     Extraocular Movements: Extraocular movements intact.      Conjunctiva/sclera: Conjunctivae normal.     Pupils: Pupils are equal, round, and reactive to light.  Neck:     Thyroid: No thyromegaly.  Cardiovascular:     Rate and Rhythm: Normal rate and regular rhythm.     Pulses: Normal pulses.     Heart sounds: Normal heart sounds. No murmur heard. Pulmonary:     Effort: Pulmonary effort is normal. No respiratory distress.     Breath sounds: Normal breath sounds. No wheezing, rhonchi or rales.  Abdominal:     General: Bowel sounds are normal. There is no distension.     Palpations: Abdomen is soft. There is no mass.     Tenderness: no abdominal tenderness There is no guarding.     Hernia: No hernia is present.  Musculoskeletal:        General: No tenderness. Normal range of motion.     Cervical back: Normal range of motion and neck supple.  Lymphadenopathy:     Cervical: No cervical adenopathy.  Skin:    General: Skin is warm and dry.     Findings: No rash.  Neurological:     Mental Status: She is alert and oriented to person, place, and time.     Cranial Nerves: No cranial nerve deficit.     Coordination: Coordination normal.     Deep Tendon Reflexes: Reflexes are normal and symmetric. Reflexes normal.  Psychiatric:        Behavior: Behavior normal.   BP 106/68   Pulse 67   Temp 98 F (36.7 C)   Resp 16   Wt 175 lb 12.8 oz (79.7 kg)   SpO2 99%   BMI 30.18 kg/m  Wt Readings from Last 3 Encounters:  03/01/21 175 lb 12.8 oz (79.7 kg)  05/25/20 180 lb (81.6 kg)  11/13/19 174 lb 6.4 oz (79.1 kg)     Lab Results  Component Value Date   WBC 6.1 03/01/2021   HGB 12.8 03/01/2021   HCT 37.6 03/01/2021   PLT 196.0 03/01/2021   GLUCOSE 87 03/01/2021   CHOL 193 03/01/2021   TRIG 116.0 03/01/2021   HDL 88.00 03/01/2021   LDLDIRECT 87.7 08/23/2012   LDLCALC 81 03/01/2021   ALT 16 03/01/2021   AST 15 03/01/2021   NA 140 03/01/2021   K 3.8 03/01/2021   CL 101 03/01/2021   CREATININE 1.22 (H) 03/01/2021   BUN 19 03/01/2021    CO2 28 03/01/2021   TSH 1.72 03/01/2021   HGBA1C 4.9 03/01/2021    MM 3D SCREEN BREAST BILATERAL  Result Date: 11/01/2020 CLINICAL DATA:  Screening. EXAM: DIGITAL SCREENING BILATERAL MAMMOGRAM WITH TOMOSYNTHESIS AND CAD TECHNIQUE: Bilateral screening digital craniocaudal and mediolateral oblique mammograms were obtained. Bilateral screening digital breast tomosynthesis was performed. The images were evaluated with computer-aided detection. COMPARISON:  Previous exam(s). ACR Breast Density Category c: The breast tissue is heterogeneously dense, which may obscure small masses. FINDINGS: There are no findings suspicious for malignancy. The images were evaluated with computer-aided  detection. IMPRESSION: No mammographic evidence of malignancy. A result letter of this screening mammogram will be mailed directly to the patient. RECOMMENDATION: Screening mammogram in one year. (Code:SM-B-01Y) BI-RADS CATEGORY  1: Negative. Electronically Signed   By: Zerita Boers M.D.   On: 11/01/2020 15:43     Assessment & Plan:  Plan    No orders of the defined types were placed in this encounter.   Problem List Items Addressed This Visit     Hypothyroidism   Relevant Orders   TSH (Completed)   Essential hypertension - Primary    Well controlled, no changes to meds. Encouraged heart healthy diet such as the DASH diet and exercise as tolerated.       Relevant Orders   CBC (Completed)   Comprehensive metabolic panel (Completed)   Obesity    Encouraged DASH or MIND diet, decrease po intake and increase exercise as tolerated. Needs 7-8 hours of sleep nightly. Avoid trans fats, eat small, frequent meals every 4-5 hours with lean proteins, complex carbs and healthy fats. Minimize simple carbs, high fat foods and processed foods      Hyperglycemia    hgba1c acceptable, minimize simple carbs. Increase exercise as tolerated.      Relevant Orders   Hemoglobin A1c (Completed)   Preventative health care    Relevant Orders   Lipid panel (Completed)   Hyperlipidemia    Encourage heart healthy diet such as MIND or DASH diet, increase exercise, avoid trans fats, simple carbohydrates and processed foods, consider a krill or fish or flaxseed oil cap daily.       Gout of right foot    Hydrate and monitor. No recent flare      Other Visit Diagnoses     Gout, unspecified cause, unspecified chronicity, unspecified site       Relevant Orders   Uric acid (Completed)       Follow-up: Return in about 6 months (around 09/01/2021) for annual exam w/gyn.  I, Suezanne Jacquet, acting as a scribe for [name], MD, have documented all relevent documentation on behalf of [name], MD, as directed by [name], MD while in the presence of [name], MD.  I, [name], MD personally performed the services described in this documentation. All medical record entries made by the scribe were at my direction and in my presence. I have reviewed the chart and agree that the record reflects my personal performance and is accurate and complete

## 2021-03-02 LAB — LIPID PANEL
Cholesterol: 193 mg/dL (ref 0–200)
HDL: 88 mg/dL (ref >39.00)
LDL Cholesterol: 81 mg/dL (ref 0–99)
NonHDL: 104.65
Total CHOL/HDL Ratio: 2
Triglycerides: 116 mg/dL (ref 0.0–149.0)
VLDL: 23.2 mg/dL (ref 0.0–40.0)

## 2021-03-02 LAB — COMPREHENSIVE METABOLIC PANEL
ALT: 16 U/L (ref 0–35)
AST: 15 U/L (ref 0–37)
Albumin: 4.5 g/dL (ref 3.5–5.2)
Alkaline Phosphatase: 49 U/L (ref 39–117)
BUN: 19 mg/dL (ref 6–23)
CO2: 28 mEq/L (ref 19–32)
Calcium: 9.9 mg/dL (ref 8.4–10.5)
Chloride: 101 mEq/L (ref 96–112)
Creatinine, Ser: 1.22 mg/dL — ABNORMAL HIGH (ref 0.40–1.20)
GFR: 48.91 mL/min — ABNORMAL LOW (ref >60.00)
Glucose, Bld: 87 mg/dL (ref 70–99)
Potassium: 3.8 mEq/L (ref 3.5–5.1)
Sodium: 140 mEq/L (ref 135–145)
Total Bilirubin: 0.4 mg/dL (ref 0.2–1.2)
Total Protein: 7.8 g/dL (ref 6.0–8.3)

## 2021-03-02 LAB — HEMOGLOBIN A1C: Hgb A1c MFr Bld: 4.9 % (ref 4.6–6.5)

## 2021-03-02 LAB — CBC
HCT: 37.6 % (ref 36.0–46.0)
Hemoglobin: 12.8 g/dL (ref 12.0–15.0)
MCHC: 34.1 g/dL (ref 30.0–36.0)
MCV: 100.1 fl — ABNORMAL HIGH (ref 78.0–100.0)
Platelets: 196 10*3/uL (ref 150.0–400.0)
RBC: 3.75 Mil/uL — ABNORMAL LOW (ref 3.87–5.11)
RDW: 14 % (ref 11.5–15.5)
WBC: 6.1 10*3/uL (ref 4.0–10.5)

## 2021-03-02 LAB — URIC ACID: Uric Acid, Serum: 8.5 mg/dL — ABNORMAL HIGH (ref 2.4–7.0)

## 2021-03-02 LAB — TSH: TSH: 1.72 u[IU]/mL (ref 0.35–5.50)

## 2021-03-06 NOTE — Assessment & Plan Note (Signed)
Encourage heart healthy diet such as MIND or DASH diet, increase exercise, avoid trans fats, simple carbohydrates and processed foods, consider a krill or fish or flaxseed oil cap daily.  °

## 2021-03-06 NOTE — Assessment & Plan Note (Signed)
hgba1c acceptable, minimize simple carbs. Increase exercise as tolerated.  

## 2021-03-06 NOTE — Assessment & Plan Note (Signed)
Encouraged DASH or MIND diet, decrease po intake and increase exercise as tolerated. Needs 7-8 hours of sleep nightly. Avoid trans fats, eat small, frequent meals every 4-5 hours with lean proteins, complex carbs and healthy fats. Minimize simple carbs, high fat foods and processed foods 

## 2021-03-06 NOTE — Assessment & Plan Note (Signed)
Hydrate and monitor. No recent flare

## 2021-03-06 NOTE — Assessment & Plan Note (Signed)
Well controlled, no changes to meds. Encouraged heart healthy diet such as the DASH diet and exercise as tolerated.  °

## 2021-03-12 ENCOUNTER — Other Ambulatory Visit: Payer: Self-pay | Admitting: Family Medicine

## 2021-05-25 ENCOUNTER — Telehealth (INDEPENDENT_AMBULATORY_CARE_PROVIDER_SITE_OTHER): Payer: 59 | Admitting: Family Medicine

## 2021-05-25 ENCOUNTER — Other Ambulatory Visit: Payer: Self-pay

## 2021-05-25 DIAGNOSIS — U071 COVID-19: Secondary | ICD-10-CM

## 2021-05-25 MED ORDER — HYDROCODONE BIT-HOMATROP MBR 5-1.5 MG/5ML PO SOLN: 5.0000 mL | Freq: Three times a day (TID) | ORAL | 0 refills | Status: AC | PRN

## 2021-05-25 MED ORDER — MOLNUPIRAVIR EUA 200MG CAPSULE
4.0000 | ORAL_CAPSULE | Freq: Two times a day (BID) | ORAL | 0 refills | Status: AC
Start: 2021-05-25 — End: 2021-05-30

## 2021-05-25 NOTE — Progress Notes (Signed)
[facility] [address], [place], [place] [address] [phone]  Date:  05/25/2021   Name:  Cindy Perkins   DOB:  07/01/1961   MRN:  [id number]  PCP:  [name], MD    Chief Complaint: No chief complaint on file.   History of Present Illness:  Cindy Perkins is a 60 y.o. very pleasant female patient who presents with the following:  primary patient of my partner Dr. Charlett Blake, virtual visit today for concern of COVID-19 Patient location is home, my location is office.  Patient identity confirmed with 2 factors, she gives consent for virtual visit today  Pt is vaccinated against covid 19- history of HTN, hyperlipidemia Friday evening she noted laryngitis- today is Wednesday She took a covid test on Sunday- negative She has been taking OTC medications She tested again today and was positive  She notes mucus in her chest and throat Her right sided throat is sore No fever noted  She is not feeling SOB at all The cough can make her feel nauseated - post tussive emesis only  She had her flu shot last week This is her 2nd time having covid 19- she had it about a year ago, but very mild   She has been quarantining at home since she got sick  Patient Active Problem List   Diagnosis Date Noted   Gout of right foot 05/25/2020   Sun-damaged skin 11/16/2019   PVC (premature ventricular contraction) 04/21/2019   Hx of colonic polyp 10/08/2017   Vaginitis 10/08/2017   Dermatitis 10/08/2017   Hyperlipidemia 10/06/2016   Other problems related to lifestyle 10/06/2016   Sacroiliac dysfunction 10/04/2015   Preventative health care 04/11/2015   Diverticulosis 04/11/2015   Cervical cancer screening 04/05/2015   Lesion of soft tissue of lower leg and ankle 03/29/2014   Hyperglycemia 03/29/2014   Chicken pox    Obesity 05/18/2010   POSTMENOPAUSAL BLEEDING 05/03/2009   Essential hypertension 10/05/2008   CLIMACTERIC  STATE, FEMALE 04/03/2008   BURN OF UNSPECIFIED DEGREE OF UPPER ARM 09/19/2007   Hypothyroidism 03/28/2007   Environmental allergies 03/28/2007   LOW BACK PAIN 03/28/2007    Past Medical History:  Diagnosis Date   ALLERGIC RHINITIS 03/28/2007   Allergy    Cervical cancer screening 04/05/2015   Chicken pox as a child   [place], FEMALE 04/03/2008   Diverticulosis 04/11/2015   By colonoscopy in November 2015   Hx of colonic polyp 10/08/2017   Hyperglycemia 03/29/2014   Hyperlipidemia, mixed 10/06/2016   HYPERTENSION, SYSTOLIC, BORDERLINE 4/40/1027   on medicine for BP   HYPOTHYROIDISM 03/28/2007   Obesity 05/18/2010   Qualifier: Diagnosis of  By: [name] MD, [name]    Postmenopausal bleeding 05/03/2009   Preventative health care 04/11/2015   Sacroiliac dysfunction 10/04/2015    Past Surgical History:  Procedure Laterality Date   COLONOSCOPY     KNEE SURGERY Left    surgical repair of injury   POLYPECTOMY     TUBAL LIGATION  1995    Social History   Tobacco Use   Smoking status: Never   Smokeless tobacco: Never  Substance Use Topics   Alcohol use: Yes    Comment: 12 beers weekly   Drug use: No    Family History  Problem Relation Age of Onset   Dementia Mother    Hypertension Mother    Hyperlipidemia Mother    COPD Mother  smoker but quit for 64 yrs   Cancer Father 67   Dementia Father        mild   Diabetes Father        type 2   Hypertension Father    Alzheimer's disease Sister    Obesity Brother    Cancer Brother 72       bone   Obesity Brother    Pulmonary embolism Brother    COPD Paternal Uncle    Heart attack Maternal Grandfather    Diabetes Paternal Grandfather        type 2   Colon cancer Neg Hx    Rectal cancer Neg Hx    Stomach cancer Neg Hx    Breast cancer Neg Hx    Colon polyps Neg Hx    Esophageal cancer Neg Hx     Allergies  Allergen Reactions   Penicillins Swelling    Medication list has been reviewed and  updated.  Current Outpatient Medications on File Prior to Visit  Medication Sig Dispense Refill   Calcium Carbonate-Vitamin D (CALCIUM-VITAMIN D) 500-200 MG-UNIT per tablet Take 1 tablet by mouth 2 (two) times daily with a meal. Reported on 10/01/2015     ibuprofen (ADVIL,MOTRIN) 200 MG tablet Take 400 mg by mouth every 6 (six) hours as needed.     levothyroxine (SYNTHROID) 100 MCG tablet TAKE 1 TABLET BY MOUTH DAILY BEFORE BREAKFAST. 90 tablet 1   Multiple Vitamin (MULTIVITAMIN) capsule Take 1 capsule by mouth daily.       olmesartan-hydrochlorothiazide (BENICAR HCT) 20-12.5 MG tablet TAKE 1 TABLET BY MOUTH EVERY DAY 90 tablet 1   No current facility-administered medications on file prior to visit.    Review of Systems:  As per HPI- otherwise negative.   Physical Examination: Vitals:   05/25/21 1159  SpO2: 96%   There were no vitals filed for this visit. There is no height or weight on file to calculate BMI. Ideal Body Weight:    Patient observed via video monitor.  She looks well, no shortness of breath or distress is noted. She is checking her oxygen level as above, but not doing other vital signs at this time Assessment and Plan: COVID-19 - Plan: molnupiravir EUA (LAGEVRIO) 200 mg CAPS capsule, HYDROcodone bit-homatropine (HYCODAN) 5-1.5 MG/5ML syrup  Virtual visit today for concern of COVID-19.  Connected with patient via video monitor  Vaccinated, for symptoms of COVID-19 began 5 days ago.  Symptoms are not severe.  She is interested in using an antiviral, and would like a cough syrup that will help her sleep Prescribed molnupiravir and Hycodan, cautioned that Hycodan may cause sedation.  Do not use when driving  Describe quarantine protocol.  She will seek care if not getting better, go to the ER if any shortness of breath or distress   Signed Lamar Blinks, MD

## 2021-06-10 ENCOUNTER — Other Ambulatory Visit: Payer: Self-pay | Admitting: Family Medicine

## 2021-09-04 ENCOUNTER — Other Ambulatory Visit: Payer: Self-pay | Admitting: Family Medicine

## 2021-09-12 ENCOUNTER — Encounter: Payer: 59 | Admitting: Family Medicine

## 2021-09-30 ENCOUNTER — Other Ambulatory Visit: Payer: Self-pay | Admitting: Family Medicine

## 2021-09-30 DIAGNOSIS — Z1231 Encounter for screening mammogram for malignant neoplasm of breast: Secondary | ICD-10-CM

## 2021-10-05 NOTE — Progress Notes (Signed)
? ?Subjective:  ? ? Patient ID: Cindy Perkins, female    DOB: [birth year], 61 y.o.   MRN: [id number] ? ?Chief Complaint  ?Patient presents with  ? Follow-up  ? ? ?HPI ?Patient is in today for her annual physical exam and follow up on chronic medical concerns. No recent febrile illness or hospitalizations. She is noting some numbness and pain in her left pinky finger at times. No falls or trauma. No swelling warmth or redness. She is trying to maintain a heart healthy diet and stay active. She manages to get 6-8 hours of sleep nightly. Hydrates well most days. Denies CP/palp/SOB/HA/congestion/fevers/GI or GU c/o. Taking meds as prescribed  ? ?Past Medical History:  ?Diagnosis Date  ? ALLERGIC RHINITIS 03/28/2007  ? Allergy   ? Cervical cancer screening 04/05/2015  ? Chicken pox as a child  ? [place], FEMALE 04/03/2008  ? Diverticulosis 04/11/2015  ? By colonoscopy in November 2015  ? Hx of colonic polyp 10/08/2017  ? Hyperglycemia 03/29/2014  ? Hyperlipidemia, mixed 10/06/2016  ? HYPERTENSION, SYSTOLIC, BORDERLINE 5/46/5681  ? on medicine for BP  ? HYPOTHYROIDISM 03/28/2007  ? Obesity 05/18/2010  ? Qualifier: Diagnosis of  By: [name] MD, Balinda Quails   ? Postmenopausal bleeding 05/03/2009  ? Preventative health care 04/11/2015  ? Sacroiliac dysfunction 10/04/2015  ? ? ?Past Surgical History:  ?Procedure Laterality Date  ? COLONOSCOPY    ? KNEE SURGERY Left   ? surgical repair of injury  ? POLYPECTOMY    ? TUBAL LIGATION  1995  ? ? ?Family History  ?Problem Relation Age of Onset  ? Dementia Mother   ? Hypertension Mother   ? Hyperlipidemia Mother   ? COPD Mother   ?     smoker but quit for 30 yrs  ? Cancer Father 83  ? Dementia Father   ?     mild  ? Diabetes Father   ?     type 2  ? Hypertension Father   ? Alzheimer's disease Sister   ? Obesity Brother   ? Cancer Brother 38  ?     bone  ? Obesity Brother   ? Pulmonary embolism Brother   ? Drug abuse Brother   ? COPD Paternal Uncle   ? Heart attack Maternal Grandfather   ?  Diabetes Paternal Grandfather   ?     type 2  ? Colon cancer Neg Hx   ? Rectal cancer Neg Hx   ? Stomach cancer Neg Hx   ? Breast cancer Neg Hx   ? Colon polyps Neg Hx   ? Esophageal cancer Neg Hx   ? ? ?Social History  ? ?Socioeconomic History  ? Marital status: Married  ?  Spouse name: Not on file  ? Number of children: Not on file  ? Years of education: Not on file  ? Highest education level: Not on file  ?Occupational History  ? Not on file  ?Tobacco Use  ? Smoking status: Never  ? Smokeless tobacco: Never  ?Substance and Sexual Activity  ? Alcohol use: Yes  ?  Comment: 12 beers weekly  ? Drug use: No  ? Sexual activity: Yes  ?  Birth control/protection: Post-menopausal  ?  Comment: lives with husband, works for [employer], no dietary restrictions  ?Other Topics Concern  ? Not on file  ?Social History Narrative  ? Not on file  ? ?Social Determinants of Health  ? ?Financial Resource Strain: Not on file  ?  Food Insecurity: Not on file  ?Transportation Needs: Not on file  ?Physical Activity: Not on file  ?Stress: Not on file  ?Social Connections: Not on file  ?Intimate Partner Violence: Not on file  ? ? ?Outpatient Medications Prior to Visit  ?Medication Sig Dispense Refill  ? Calcium Carbonate-Vitamin D (CALCIUM-VITAMIN D) 500-200 MG-UNIT per tablet Take 1 tablet by mouth 2 (two) times daily with a meal. Reported on 10/01/2015    ? ibuprofen (ADVIL,MOTRIN) 200 MG tablet Take 400 mg by mouth every 6 (six) hours as needed.    ? levothyroxine (SYNTHROID) 100 MCG tablet TAKE 1 TABLET BY MOUTH EVERY DAY BEFORE BREAKFAST 90 tablet 1  ? Multiple Vitamin (MULTIVITAMIN) capsule Take 1 capsule by mouth daily.      ? olmesartan-hydrochlorothiazide (BENICAR HCT) 20-12.5 MG tablet TAKE 1 TABLET BY MOUTH EVERY DAY 90 tablet 1  ? ?No facility-administered medications prior to visit.  ? ? ?Allergies  ?Allergen Reactions  ? Penicillins Swelling  ? ? ?Review of Systems  ?Constitutional:  Negative for chills, fever and  malaise/fatigue.  ?HENT:  Negative for congestion and hearing loss.   ?Eyes:  Negative for discharge.  ?Respiratory:  Negative for cough, sputum production and shortness of breath.   ?Cardiovascular:  Negative for chest pain, palpitations and leg swelling.  ?Gastrointestinal:  Negative for abdominal pain, blood in stool, constipation, diarrhea, heartburn, nausea and vomiting.  ?Genitourinary:  Negative for dysuria, frequency, hematuria and urgency.  ?Musculoskeletal:  Positive for joint pain. Negative for back pain, falls and myalgias.  ?Skin:  Negative for rash.  ?Neurological:  Negative for dizziness, sensory change, loss of consciousness, weakness and headaches.  ?Endo/Heme/Allergies:  Negative for environmental allergies. Does not bruise/bleed easily.  ?Psychiatric/Behavioral:  Negative for depression and suicidal ideas. The patient is not nervous/anxious and does not have insomnia.   ? ?   ?Objective:  ?  ?Physical Exam ?Constitutional:   ?   General: She is not in acute distress. ?   Appearance: She is well-developed.  ?HENT:  ?   Head: Normocephalic and atraumatic.  ?Eyes:  ?   Conjunctiva/sclera: Conjunctivae normal.  ?Neck:  ?   Thyroid: No thyromegaly.  ?Cardiovascular:  ?   Rate and Rhythm: Normal rate and regular rhythm.  ?   Heart sounds: Normal heart sounds. No murmur heard. ?Pulmonary:  ?   Effort: Pulmonary effort is normal. No respiratory distress.  ?   Breath sounds: Normal breath sounds.  ?Abdominal:  ?   General: Bowel sounds are normal. There is no distension.  ?   Palpations: Abdomen is soft. There is no mass.  ?   Tenderness: There is no abdominal tenderness.  ?Musculoskeletal:  ?   Cervical back: Neck supple.  ?Lymphadenopathy:  ?   Cervical: No cervical adenopathy.  ?Skin: ?   General: Skin is warm and dry.  ?Neurological:  ?   Mental Status: She is alert and oriented to person, place, and time.  ?Psychiatric:     ?   Behavior: Behavior normal.  ? ? ?BP 124/70 (BP Location: Left Arm, Patient  Position: Sitting, Cuff Size: Normal)   Pulse 81   Resp 20   Ht '5\' 4"'$  (1.626 m)   Wt 173 lb 6.4 oz (78.7 kg)   SpO2 99%   BMI 29.76 kg/m?  ?Wt Readings from Last 3 Encounters:  ?10/06/21 173 lb 6.4 oz (78.7 kg)  ?03/01/21 175 lb 12.8 oz (79.7 kg)  ?05/25/20 180 lb (81.6 kg)  ? ? ?  Diabetic Foot Exam - Simple   ?No data filed ?  ? ?Lab Results  ?Component Value Date  ? WBC 5.6 10/06/2021  ? HGB 13.4 10/06/2021  ? HCT 39.3 10/06/2021  ? PLT 208.0 10/06/2021  ? GLUCOSE 98 10/06/2021  ? CHOL 207 (H) 10/06/2021  ? TRIG 97.0 10/06/2021  ? HDL 91.60 10/06/2021  ? LDLDIRECT 87.7 08/23/2012  ? [place] 96 10/06/2021  ? ALT 18 10/06/2021  ? AST 19 10/06/2021  ? NA 140 10/06/2021  ? K 4.1 10/06/2021  ? CL 103 10/06/2021  ? CREATININE 0.97 10/06/2021  ? BUN 21 10/06/2021  ? CO2 29 10/06/2021  ? TSH 1.63 10/06/2021  ? HGBA1C 5.1 10/06/2021  ? ? ?Lab Results  ?Component Value Date  ? TSH 1.63 10/06/2021  ? ?Lab Results  ?Component Value Date  ? WBC 5.6 10/06/2021  ? HGB 13.4 10/06/2021  ? HCT 39.3 10/06/2021  ? MCV 95.9 10/06/2021  ? PLT 208.0 10/06/2021  ? ?Lab Results  ?Component Value Date  ? NA 140 10/06/2021  ? K 4.1 10/06/2021  ? CO2 29 10/06/2021  ? GLUCOSE 98 10/06/2021  ? BUN 21 10/06/2021  ? CREATININE 0.97 10/06/2021  ? BILITOT 0.4 10/06/2021  ? ALKPHOS 51 10/06/2021  ? AST 19 10/06/2021  ? ALT 18 10/06/2021  ? PROT 7.1 10/06/2021  ? ALBUMIN 4.5 10/06/2021  ? CALCIUM 9.6 10/06/2021  ? GFR 64.13 10/06/2021  ? ?Lab Results  ?Component Value Date  ? CHOL 207 (H) 10/06/2021  ? ?Lab Results  ?Component Value Date  ? HDL 91.60 10/06/2021  ? ?Lab Results  ?Component Value Date  ? [place] 96 10/06/2021  ? ?Lab Results  ?Component Value Date  ? TRIG 97.0 10/06/2021  ? ?Lab Results  ?Component Value Date  ? CHOLHDL 2 10/06/2021  ? ?Lab Results  ?Component Value Date  ? HGBA1C 5.1 10/06/2021  ? ? ?   ?Assessment & Plan:  ? ?Problem List Items Addressed This Visit   ? ? Hypothyroidism  ?  On Levothyroxine, continue to  monitor ?  ?  ? Relevant Orders  ? TSH (Completed)  ? Hyperglycemia  ?  hgba1c acceptable, minimize simple carbs. Increase exercise as tolerated. ?  ?  ? Relevant Orders  ? Hemoglobin A1c (Completed)  ? Preventative health car

## 2021-10-06 ENCOUNTER — Ambulatory Visit (INDEPENDENT_AMBULATORY_CARE_PROVIDER_SITE_OTHER): Payer: 59 | Admitting: Family Medicine

## 2021-10-06 ENCOUNTER — Encounter: Payer: Self-pay | Admitting: Family Medicine

## 2021-10-06 VITALS — BP 124/70 | HR 81 | Resp 20 | Ht 64.0 in | Wt 173.4 lb

## 2021-10-06 DIAGNOSIS — Z0000 Encounter for general adult medical examination without abnormal findings: Secondary | ICD-10-CM

## 2021-10-06 DIAGNOSIS — E039 Hypothyroidism, unspecified: Secondary | ICD-10-CM

## 2021-10-06 DIAGNOSIS — E785 Hyperlipidemia, unspecified: Secondary | ICD-10-CM | POA: Diagnosis not present

## 2021-10-06 DIAGNOSIS — Z23 Encounter for immunization: Secondary | ICD-10-CM | POA: Diagnosis not present

## 2021-10-06 DIAGNOSIS — L578 Other skin changes due to chronic exposure to nonionizing radiation: Secondary | ICD-10-CM

## 2021-10-06 DIAGNOSIS — F419 Anxiety disorder, unspecified: Secondary | ICD-10-CM

## 2021-10-06 DIAGNOSIS — R739 Hyperglycemia, unspecified: Secondary | ICD-10-CM | POA: Diagnosis not present

## 2021-10-06 DIAGNOSIS — M79645 Pain in left finger(s): Secondary | ICD-10-CM

## 2021-10-06 MED ORDER — LORAZEPAM 0.5 MG PO TABS
0.5000 mg | ORAL_TABLET | Freq: Two times a day (BID) | ORAL | 1 refills | Status: DC | PRN
Start: 2021-10-06 — End: 2024-02-21

## 2021-10-07 LAB — CBC
HCT: 39.3 % (ref 36.0–46.0)
Hemoglobin: 13.4 g/dL (ref 12.0–15.0)
MCHC: 34 g/dL (ref 30.0–36.0)
MCV: 95.9 fl (ref 78.0–100.0)
Platelets: 208 10*3/uL (ref 150.0–400.0)
RBC: 4.1 Mil/uL (ref 3.87–5.11)
RDW: 13.2 % (ref 11.5–15.5)
WBC: 5.6 10*3/uL (ref 4.0–10.5)

## 2021-10-07 LAB — COMPREHENSIVE METABOLIC PANEL
ALT: 18 U/L (ref 0–35)
AST: 19 U/L (ref 0–37)
Albumin: 4.5 g/dL (ref 3.5–5.2)
Alkaline Phosphatase: 51 U/L (ref 39–117)
BUN: 21 mg/dL (ref 6–23)
CO2: 29 mEq/L (ref 19–32)
Calcium: 9.6 mg/dL (ref 8.4–10.5)
Chloride: 103 mEq/L (ref 96–112)
Creatinine, Ser: 0.97 mg/dL (ref 0.40–1.20)
GFR: 64.13 mL/min (ref >60.00)
Glucose, Bld: 98 mg/dL (ref 70–99)
Potassium: 4.1 mEq/L (ref 3.5–5.1)
Sodium: 140 mEq/L (ref 135–145)
Total Bilirubin: 0.4 mg/dL (ref 0.2–1.2)
Total Protein: 7.1 g/dL (ref 6.0–8.3)

## 2021-10-07 LAB — LIPID PANEL
Cholesterol: 207 mg/dL — ABNORMAL HIGH (ref 0–200)
HDL: 91.6 mg/dL (ref >39.00)
LDL Cholesterol: 96 mg/dL (ref 0–99)
NonHDL: 115.27
Total CHOL/HDL Ratio: 2
Triglycerides: 97 mg/dL (ref 0.0–149.0)
VLDL: 19.4 mg/dL (ref 0.0–40.0)

## 2021-10-07 LAB — TSH: TSH: 1.63 u[IU]/mL (ref 0.35–5.50)

## 2021-10-07 LAB — HEMOGLOBIN A1C: Hgb A1c MFr Bld: 5.1 % (ref 4.6–6.5)

## 2021-10-09 DIAGNOSIS — M79645 Pain in left finger(s): Secondary | ICD-10-CM | POA: Insufficient documentation

## 2021-10-09 DIAGNOSIS — F419 Anxiety disorder, unspecified: Secondary | ICD-10-CM | POA: Insufficient documentation

## 2021-10-09 NOTE — Assessment & Plan Note (Signed)
Doing well most days but allowed Lorazepam to use sparingly prn ?

## 2021-10-09 NOTE — Assessment & Plan Note (Signed)
Encourage heart healthy diet such as MIND or DASH diet, increase exercise, avoid trans fats, simple carbohydrates and processed foods, consider a krill or fish or flaxseed oil cap daily.  °

## 2021-10-09 NOTE — Assessment & Plan Note (Signed)
hgba1c acceptable, minimize simple carbs. Increase exercise as tolerated.  

## 2021-10-09 NOTE — Assessment & Plan Note (Signed)
Has intermittent numbness and pain in left pinky finger. No fall or injury. Encouraged ice and lidocaine to left wrist bid and splint qhs. Let us know if no improvement. ?

## 2021-10-09 NOTE — Assessment & Plan Note (Signed)
On Levothyroxine, continue to monitor 

## 2021-10-09 NOTE — Assessment & Plan Note (Signed)
Patient encouraged to maintain heart healthy diet, regular exercise, adequate sleep. Consider daily probiotics. Take medications as prescribed. Labs ordered and reviewed. Referred to dermatology for further consideration. Last [facility] 10/2020 repeat in 2023. Last Pap 2020 repeat in 2025. Dexa next year. Given first Shingrix, repeat in 2-6 months. Immunizations otherwise up to date.  ?

## 2021-11-02 ENCOUNTER — Ambulatory Visit
Admission: RE | Admit: 2021-11-02 | Discharge: 2021-11-02 | Disposition: A | Discharge status: Home / Self Care | Payer: 59 | Source: Ambulatory Visit | Attending: Family Medicine | Admitting: Family Medicine

## 2021-11-02 DIAGNOSIS — Z1231 Encounter for screening mammogram for malignant neoplasm of breast: Secondary | ICD-10-CM

## 2021-12-08 ENCOUNTER — Other Ambulatory Visit: Payer: Self-pay | Admitting: Family Medicine

## 2021-12-20 ENCOUNTER — Ambulatory Visit (INDEPENDENT_AMBULATORY_CARE_PROVIDER_SITE_OTHER): Payer: 59

## 2021-12-20 DIAGNOSIS — Z23 Encounter for immunization: Secondary | ICD-10-CM

## 2021-12-20 NOTE — Progress Notes (Unsigned)
Cindy Perkins is a 61 y.o. female presents to the office today for # 2 Shingrix injections, per physician's orders. Original order:  *** (med), *** (dose),  left deltoid (route) was administered  (location) today. Patient tolerated injection.  Patient next injection due: , appt made   [facility] M Tameka Hoiland

## 2022-03-02 ENCOUNTER — Other Ambulatory Visit: Payer: Self-pay | Admitting: Family Medicine

## 2022-06-02 ENCOUNTER — Other Ambulatory Visit: Payer: Self-pay | Admitting: Family Medicine

## 2022-08-14 ENCOUNTER — Other Ambulatory Visit: Payer: Self-pay | Admitting: Family Medicine

## 2022-10-03 ENCOUNTER — Other Ambulatory Visit: Payer: Self-pay | Admitting: Family Medicine

## 2022-10-03 DIAGNOSIS — Z1231 Encounter for screening mammogram for malignant neoplasm of breast: Secondary | ICD-10-CM

## 2022-10-11 NOTE — Assessment & Plan Note (Signed)
Encourage heart healthy diet such as MIND or DASH diet, increase exercise, avoid trans fats, simple carbohydrates and processed foods, consider a krill or fish or flaxseed oil cap daily.  °

## 2022-10-11 NOTE — Assessment & Plan Note (Signed)
Patient encouraged to maintain heart healthy diet, regular exercise, adequate sleep. Consider daily probiotics. Take medications as prescribed. Labs ordered and reviewed. Referred to dermatology for further consideration. Last [facility] 10/2021 repeat in 2024. Last Pap 2020 repeat today. Dexa ordered.  Colonoscopy 2020 repeat 2025

## 2022-10-11 NOTE — Assessment & Plan Note (Signed)
Well controlled, no changes to meds. Encouraged heart healthy diet such as the DASH diet and exercise as tolerated.  °

## 2022-10-11 NOTE — Assessment & Plan Note (Signed)
On Levothyroxine, continue to monitor 

## 2022-10-11 NOTE — Assessment & Plan Note (Signed)
hgba1c acceptable, minimize simple carbs. Increase exercise as tolerated.  

## 2022-10-11 NOTE — Assessment & Plan Note (Signed)
Hydrate and monitor 

## 2022-10-12 ENCOUNTER — Encounter: Payer: Self-pay | Admitting: Family Medicine

## 2022-10-12 ENCOUNTER — Ambulatory Visit (INDEPENDENT_AMBULATORY_CARE_PROVIDER_SITE_OTHER): Payer: 59 | Admitting: Family Medicine

## 2022-10-12 VITALS — BP 120/72 | HR 79 | Temp 97.5°F | Resp 16 | Ht 64.0 in | Wt 177.2 lb

## 2022-10-12 DIAGNOSIS — M109 Gout, unspecified: Secondary | ICD-10-CM | POA: Diagnosis not present

## 2022-10-12 DIAGNOSIS — Z0000 Encounter for general adult medical examination without abnormal findings: Secondary | ICD-10-CM

## 2022-10-12 DIAGNOSIS — R739 Hyperglycemia, unspecified: Secondary | ICD-10-CM | POA: Diagnosis not present

## 2022-10-12 DIAGNOSIS — E785 Hyperlipidemia, unspecified: Secondary | ICD-10-CM

## 2022-10-12 DIAGNOSIS — I10 Essential (primary) hypertension: Secondary | ICD-10-CM | POA: Diagnosis not present

## 2022-10-12 DIAGNOSIS — E039 Hypothyroidism, unspecified: Secondary | ICD-10-CM

## 2022-10-12 LAB — COMPREHENSIVE METABOLIC PANEL
ALT: 20 U/L (ref 0–35)
AST: 23 U/L (ref 0–37)
Albumin: 4.6 g/dL (ref 3.5–5.2)
Alkaline Phosphatase: 63 U/L (ref 39–117)
BUN: 19 mg/dL (ref 6–23)
CO2: 28 mEq/L (ref 19–32)
Calcium: 9.8 mg/dL (ref 8.4–10.5)
Chloride: 101 mEq/L (ref 96–112)
Creatinine, Ser: 1.02 mg/dL (ref 0.40–1.20)
GFR: 59.95 mL/min — ABNORMAL LOW (ref >60.00)
Glucose, Bld: 94 mg/dL (ref 70–99)
Potassium: 4 mEq/L (ref 3.5–5.1)
Sodium: 138 mEq/L (ref 135–145)
Total Bilirubin: 0.7 mg/dL (ref 0.2–1.2)
Total Protein: 7.6 g/dL (ref 6.0–8.3)

## 2022-10-12 LAB — TSH: TSH: 2.05 u[IU]/mL (ref 0.35–5.50)

## 2022-10-12 LAB — CBC WITH DIFFERENTIAL/PLATELET
Basophils Absolute: 0 10*3/uL (ref 0.0–0.1)
Basophils Relative: 0.8 % (ref 0.0–3.0)
Eosinophils Absolute: 0.1 10*3/uL (ref 0.0–0.7)
Eosinophils Relative: 1.8 % (ref 0.0–5.0)
HCT: 41 % (ref 36.0–46.0)
Hemoglobin: 13.8 g/dL (ref 12.0–15.0)
Lymphocytes Relative: 23.9 % (ref 12.0–46.0)
Lymphs Abs: 1.4 10*3/uL (ref 0.7–4.0)
MCHC: 33.6 g/dL (ref 30.0–36.0)
MCV: 95.8 fl (ref 78.0–100.0)
Monocytes Absolute: 0.5 10*3/uL (ref 0.1–1.0)
Monocytes Relative: 7.8 % (ref 3.0–12.0)
Neutro Abs: 3.9 10*3/uL (ref 1.4–7.7)
Neutrophils Relative %: 65.7 % (ref 43.0–77.0)
Platelets: 224 10*3/uL (ref 150.0–400.0)
RBC: 4.28 Mil/uL (ref 3.87–5.11)
RDW: 13.6 % (ref 11.5–15.5)
WBC: 5.9 10*3/uL (ref 4.0–10.5)

## 2022-10-12 LAB — LIPID PANEL
Cholesterol: 211 mg/dL — ABNORMAL HIGH (ref 0–200)
HDL: 104.7 mg/dL (ref >39.00)
LDL Cholesterol: 82 mg/dL (ref 0–99)
NonHDL: 105.83
Total CHOL/HDL Ratio: 2
Triglycerides: 118 mg/dL (ref 0.0–149.0)
VLDL: 23.6 mg/dL (ref 0.0–40.0)

## 2022-10-12 LAB — URIC ACID: Uric Acid, Serum: 7.8 mg/dL — ABNORMAL HIGH (ref 2.4–7.0)

## 2022-10-12 LAB — HEMOGLOBIN A1C: Hgb A1c MFr Bld: 5 % (ref 4.6–6.5)

## 2022-10-12 NOTE — Progress Notes (Signed)
Subjective:   By signing my name below, I, Cindy Perkins, attest that this documentation has been prepared under the direction and in the presence of Cindy Lukes, MD., 10/12/2022.   Patient ID: Cindy Perkins, female    DOB: Jan 28, 1961, 62 y.o.   MRN: [id number]  Chief Complaint  Patient presents with   Annual Exam    Annual Exam    HPI Patient is in today for a comprehensive physical exam and follow up on chronic medical concerns.   Diet/Exercise Patient is doing well today and reports that she has been maintaining a balanced diet and exercising regularly.  Gout Patient denies recent pain associated with gout and reports it has been well-controlled. Today she denies CP/palpitations/SOB/HA/ fever/chills/GI or GU symptoms. Lab Results  Component Value Date   LABURIC 8.5 (H) 03/01/2021   Sun Damaged Skin Patient states that she recently saw Dr. [name], MD., [facility] 07/20/2022 for a routine skin exam. She reports no concerns and will return in 1 year.  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/2015    Past Surgical History:  Procedure Laterality Date   COLONOSCOPY     KNEE SURGERY Left    surgical repair of injury   POLYPECTOMY     TUBAL LIGATION  1995    Family History  Problem Relation Age of Onset   Dementia Mother    Hypertension Mother    Hyperlipidemia Mother    COPD  Mother        smoker but quit for 62 yrs   Cancer Father 45   Dementia Father        mild   Diabetes Father        type 2   Hypertension Father    Alzheimer's disease Sister    Obesity Brother    Cancer Brother 72       bone   Obesity Brother    Pulmonary embolism Brother    Drug abuse Brother    COPD Paternal Uncle    Heart attack Maternal Grandfather    Diabetes Paternal Grandfather        type 2   Colon cancer Neg Hx    Rectal cancer Neg Hx    Stomach cancer Neg Hx    Breast cancer Neg Hx    Colon polyps Neg Hx    Esophageal cancer Neg Hx     Social History   Socioeconomic History   Marital status: Married    Spouse name: Not on file   Number of children: Not on file   Years of education: Not on file   Highest education level: Not on file  Occupational History   Not on file  Tobacco Use   Smoking status: Never   Smokeless tobacco: Never  Substance and Sexual Activity   Alcohol use: Yes    Comment: 12 beers weekly  Drug use: No   Sexual activity: Yes    Birth control/protection: Post-menopausal    Comment: lives with husband, works for [employer], no dietary restrictions  Other Topics Concern   Not on file  Social History Narrative   Not on file   Social Determinants of Health   Financial Resource Strain: Not on file  Food Insecurity: Not on file  Transportation Needs: Not on file  Physical Activity: Not on file  Stress: Not on file  Social Connections: Not on file  Intimate Partner Violence: Not on file    Outpatient Medications Prior to Visit  Medication Sig Dispense Refill   Calcium Carbonate-Vitamin D (CALCIUM-VITAMIN D) 500-200 MG-UNIT per tablet Take 1 tablet by mouth 2 (two) times daily with a meal. Reported on 10/01/2015     ibuprofen (ADVIL,MOTRIN) 200 MG tablet Take 400 mg by mouth every 6 (six) hours as needed.     levothyroxine (SYNTHROID) 100 MCG tablet TAKE 1 TABLET BY MOUTH EVERY DAY BEFORE BREAKFAST 90 tablet 1   Multiple  Vitamin (MULTIVITAMIN) capsule Take 1 capsule by mouth daily.       olmesartan-hydrochlorothiazide (BENICAR HCT) 20-12.5 MG tablet TAKE 1 TABLET BY MOUTH EVERY DAY 90 tablet 1   LORazepam (ATIVAN) 0.5 MG tablet Take 1 tablet (0.5 mg total) by mouth 2 (two) times daily as needed for anxiety. (Patient not taking: Reported on 10/12/2022) 20 tablet 1   No facility-administered medications prior to visit.    Allergies  Allergen Reactions   Penicillins Swelling    Review of Systems  Constitutional:  Negative for chills and fever.  Respiratory:  Negative for shortness of breath.   Cardiovascular:  Negative for chest pain and palpitations.  Gastrointestinal:  Negative for abdominal pain, blood in stool, constipation, diarrhea, nausea and vomiting.  Genitourinary:  Negative for dysuria, frequency, hematuria and urgency.  Skin:           Neurological:  Negative for headaches.       Objective:    Physical Exam Constitutional:      General: She is not in acute distress.    Appearance: Normal appearance. She is normal weight. She is not ill-appearing.  HENT:     Head: Normocephalic and atraumatic.     Right Ear: Tympanic membrane, ear canal and external ear normal.     Left Ear: Tympanic membrane, ear canal and external ear normal.     Nose: Nose normal.     Mouth/Throat:     Mouth: Mucous membranes are moist.     Pharynx: Oropharynx is clear.  Eyes:     General:        Right eye: No discharge.        Left eye: No discharge.     Extraocular Movements: Extraocular movements intact.     Right eye: No nystagmus.     Left eye: No nystagmus.     Pupils: Pupils are equal, round, and reactive to light.  Neck:     Vascular: No carotid bruit.  Cardiovascular:     Rate and Rhythm: Normal rate and regular rhythm.     Pulses: Normal pulses.     Heart sounds: Normal heart sounds. No murmur heard.    No gallop.  Pulmonary:     Effort: Pulmonary effort is normal. No respiratory distress.      Breath sounds: Normal breath sounds. No wheezing or rales.  Abdominal:     General: Bowel sounds are normal.  Palpations: Abdomen is soft.     Tenderness: There is no abdominal tenderness. There is no guarding.  Musculoskeletal:        General: Normal range of motion.     Cervical back: Normal range of motion.     Right lower leg: No edema.     Left lower leg: No edema.     Comments: Muscle strength 5/5 on upper and lower extremities.   Lymphadenopathy:     Cervical: No cervical adenopathy.  Skin:    General: Skin is warm and dry.  Neurological:     Mental Status: She is alert and oriented to person, place, and time.     Sensory: Sensation is intact.     Motor: Motor function is intact.     Coordination: Coordination is intact.     Deep Tendon Reflexes:     Reflex Scores:      Patellar reflexes are 2+ on the right side and 2+ on the left side. Psychiatric:        Mood and Affect: Mood normal.        Behavior: Behavior normal.        Judgment: Judgment normal.     BP 120/72 (BP Location: Right Arm, Patient Position: Sitting, Cuff Size: Normal)   Pulse 79   Temp (!) 97.5 F (36.4 C) (Oral)   Resp 16   Ht 5\' 4"  (1.626 m)   Wt 177 lb 3.2 oz (80.4 kg)   SpO2 98%   BMI 30.42 kg/m  Wt Readings from Last 3 Encounters:  10/12/22 177 lb 3.2 oz (80.4 kg)  10/06/21 173 lb 6.4 oz (78.7 kg)  03/01/21 175 lb 12.8 oz (79.7 kg)    Diabetic Foot Exam - Simple   No data filed    Lab Results  Component Value Date   WBC 5.6 10/06/2021   HGB 13.4 10/06/2021   HCT 39.3 10/06/2021   PLT 208.0 10/06/2021   GLUCOSE 98 10/06/2021   CHOL 207 (H) 10/06/2021   TRIG 97.0 10/06/2021   HDL 91.60 10/06/2021   LDLDIRECT 87.7 08/23/2012   LDLCALC 96 10/06/2021   ALT 18 10/06/2021   AST 19 10/06/2021   NA 140 10/06/2021   K 4.1 10/06/2021   CL 103 10/06/2021   CREATININE 0.97 10/06/2021   BUN 21 10/06/2021   CO2 29 10/06/2021   TSH 1.63 10/06/2021   HGBA1C 5.1 10/06/2021     Lab Results  Component Value Date   TSH 1.63 10/06/2021   Lab Results  Component Value Date   WBC 5.6 10/06/2021   HGB 13.4 10/06/2021   HCT 39.3 10/06/2021   MCV 95.9 10/06/2021   PLT 208.0 10/06/2021   Lab Results  Component Value Date   NA 140 10/06/2021   K 4.1 10/06/2021   CO2 29 10/06/2021   GLUCOSE 98 10/06/2021   BUN 21 10/06/2021   CREATININE 0.97 10/06/2021   BILITOT 0.4 10/06/2021   ALKPHOS 51 10/06/2021   AST 19 10/06/2021   ALT 18 10/06/2021   PROT 7.1 10/06/2021   ALBUMIN 4.5 10/06/2021   CALCIUM 9.6 10/06/2021   GFR 64.13 10/06/2021   Lab Results  Component Value Date   CHOL 207 (H) 10/06/2021   Lab Results  Component Value Date   HDL 91.60 10/06/2021   Lab Results  Component Value Date   LDLCALC 96 10/06/2021   Lab Results  Component Value Date   TRIG 97.0 10/06/2021   Lab Results  Component  Value Date   CHOLHDL 2 10/06/2021   Lab Results  Component Value Date   HGBA1C 5.1 10/06/2021      Assessment & Plan:  Colonoscopy: Last completed on 06/09/2019. Impression:  - One 8 mm polyp at the hepatic flexure, removed with a cold snare. Resected and retrieved. - One 6 mm polyp in the transverse colon, removed with a cold snare. Resected and retrieved.  - Diverticulosis in the sigmoid colon and in the ascending colon.  - The distal rectum and anal verge are normal on retroflexion view. Repeat in 5 years.  Mammogram: Last completed on 11/02/2021 with no mammographic evidence of malignancy. Repeat scheduled for 11/2022.  Pap Smear: Last completed on 10/08/2017. Normal results but positive for Gardnerella Vaginalis which was managed with Metronidazole 500 mg po bid x 7 days. Repeat in 3-5 years.   Advanced Directives: Encouraged patient to complete advanced care planning documents.   Healthy Lifestyle: Encouraged 6-8 hours of sleep, heart healthy diet, 60-80 oz of non-alcohol/non-caffeinated fluids, and minimum of 4000 steps  daily.  Immunizations: Reviewed patient's immunization history and encouraged patient to consider annual COVID-19 and Flu vaccinations. Tetanus vaccination due in 2028 unless injured before then.  Labs: Routine blood work ordered today.  Problem List Items Addressed This Visit     Essential hypertension    Well controlled, no changes to meds. Encouraged heart healthy diet such as the DASH diet and exercise as tolerated.       Relevant Orders   CBC with Differential/Platelet   Comprehensive metabolic panel   Gout of right foot    Hydrate and monitor       Relevant Orders   Uric acid   Hyperglycemia - Primary    hgba1c acceptable, minimize simple carbs. Increase exercise as tolerated.      Relevant Orders   HgB A1c   Hyperlipidemia    Encourage heart healthy diet such as MIND or DASH diet, increase exercise, avoid trans fats, simple carbohydrates and processed foods, consider a krill or fish or flaxseed oil cap daily.       Relevant Orders   Lipid panel   Hypothyroidism    On Levothyroxine, continue to monitor      Relevant Orders   TSH   Preventative health care    Patient encouraged to maintain heart healthy diet, regular exercise, adequate sleep. Consider daily probiotics. Take medications as prescribed. Labs ordered and reviewed. Last [facility] 10/2021 repeat in 2024. Last Pap 2020 repeat 2025. Dexa ordered.  Colonoscopy 2020 repeat 2025.       No orders of the defined types were placed in this encounter.  I, [name], MD, personally preformed the services described in this documentation.  All medical record entries made by the scribe were at my direction and in my presence.  I have reviewed the chart and discharge instructions (if applicable) and agree that the record reflects my personal performance and is accurate and complete. 10/12/2022  I,Mohammed Iqbal,acting as a scribe for [name], MD.,have documented all relevant documentation on the behalf of [name], MD,as directed by  [name], MD while in the presence of [name], MD.  [name], MD

## 2022-10-12 NOTE — Patient Instructions (Signed)
Preventive Care 40-60 Years Old, Female Preventive care refers to lifestyle choices and visits with your health care provider that can promote health and wellness. Preventive care visits are also called wellness exams. What can I expect for my preventive care visit? Counseling Your health care provider may ask you questions about your: Medical history, including: Past medical problems. Family medical history. Pregnancy history. Current health, including: Menstrual cycle. Method of birth control. Emotional well-being. Home life and relationship well-being. Sexual activity and sexual health. Lifestyle, including: Alcohol, nicotine or tobacco, and drug use. Access to firearms. Diet, exercise, and sleep habits. Work and work environment. Sunscreen use. Safety issues such as seatbelt and bike helmet use. Physical exam Your health care provider will check your: Height and weight. These may be used to calculate your BMI (body mass index). BMI is a measurement that tells if you are at a healthy weight. Waist circumference. This measures the distance around your waistline. This measurement also tells if you are at a healthy weight and may help predict your risk of certain diseases, such as type 2 diabetes and high blood pressure. Heart rate and blood pressure. Body temperature. Skin for abnormal spots. What immunizations do I need?  Vaccines are usually given at various ages, according to a schedule. Your health care provider will recommend vaccines for you based on your age, medical history, and lifestyle or other factors, such as travel or where you work. What tests do I need? Screening Your health care provider may recommend screening tests for certain conditions. This may include: Lipid and cholesterol levels. Diabetes screening. This is done by checking your blood sugar (glucose) after you have not eaten for a while (fasting). Pelvic exam and Pap test. Hepatitis B test. Hepatitis C  test. HIV (human immunodeficiency virus) test. STI (sexually transmitted infection) testing, if you are at risk. Lung cancer screening. Colorectal cancer screening. Mammogram. Talk with your health care provider about when you should start having regular mammograms. This may depend on whether you have a family history of breast cancer. BRCA-related cancer screening. This may be done if you have a family history of breast, ovarian, tubal, or peritoneal cancers. Bone density scan. This is done to screen for osteoporosis. Talk with your health care provider about your test results, treatment options, and if necessary, the need for more tests. Follow these instructions at home: Eating and drinking  Eat a diet that includes fresh fruits and vegetables, whole grains, lean protein, and low-fat dairy products. Take vitamin and mineral supplements as recommended by your health care provider. Do not drink alcohol if: Your health care provider tells you not to drink. You are pregnant, may be pregnant, or are planning to become pregnant. If you drink alcohol: Limit how much you have to 0-1 drink a day. Know how much alcohol is in your drink. In the [place] one drink equals one 12 oz bottle of beer (355 mL), one 5 oz glass of wine (148 mL), or one 1 oz glass of hard liquor (44 mL). Lifestyle Brush your teeth every morning and night with fluoride toothpaste. Floss one time each day. Exercise for at least 30 minutes 5 or more days each week. Do not use any products that contain nicotine or tobacco. These products include cigarettes, chewing tobacco, and vaping devices, such as e-cigarettes. If you need help quitting, ask your health care provider. Do not use drugs. If you are sexually active, practice safe sex. Use a condom or other form of protection to   prevent STIs. If you do not wish to become pregnant, use a form of birth control. If you plan to become pregnant, see your health care provider for a  prepregnancy visit. Take aspirin only as told by your health care provider. Make sure that you understand how much to take and what form to take. Work with your health care provider to find out whether it is safe and beneficial for you to take aspirin daily. Find healthy ways to manage stress, such as: Meditation, yoga, or listening to music. Journaling. Talking to a trusted person. Spending time with friends and family. Minimize exposure to UV radiation to reduce your risk of skin cancer. Safety Always wear your seat belt while driving or riding in a vehicle. Do not drive: If you have been drinking alcohol. Do not ride with someone who has been drinking. When you are tired or distracted. While texting. If you have been using any mind-altering substances or drugs. Wear a helmet and other protective equipment during sports activities. If you have firearms in your house, make sure you follow all gun safety procedures. Seek help if you have been physically or sexually abused. What's next? Visit your health care provider once a year for an annual wellness visit. Ask your health care provider how often you should have your eyes and teeth checked. Stay up to date on all vaccines. This information is not intended to replace advice given to you by your health care provider. Make sure you discuss any questions you have with your health care provider. Document Revised: 12/29/2020 Document Reviewed: 12/29/2020 Elsevier Patient Education  2023 [employer] Inc.  

## 2022-11-16 ENCOUNTER — Ambulatory Visit
Admission: RE | Admit: 2022-11-16 | Discharge: 2022-11-16 | Disposition: A | Discharge status: Home / Self Care | Payer: 59 | Source: Ambulatory Visit | Attending: Family Medicine | Admitting: Family Medicine

## 2022-11-16 DIAGNOSIS — Z1231 Encounter for screening mammogram for malignant neoplasm of breast: Secondary | ICD-10-CM

## 2022-11-18 ENCOUNTER — Other Ambulatory Visit: Payer: Self-pay | Admitting: Family Medicine

## 2023-01-25 ENCOUNTER — Encounter: Payer: Self-pay | Admitting: Family Medicine

## 2023-01-25 ENCOUNTER — Ambulatory Visit: Payer: 59 | Admitting: Family Medicine

## 2023-01-25 VITALS — BP 129/61 | HR 66 | Temp 97.7°F | Resp 20 | Wt 182.0 lb

## 2023-01-25 DIAGNOSIS — H6690 Otitis media, unspecified, unspecified ear: Secondary | ICD-10-CM | POA: Diagnosis not present

## 2023-01-25 MED ORDER — CEFUROXIME AXETIL 250 MG PO TABS: 250.0000 mg | ORAL_TABLET | Freq: Two times a day (BID) | ORAL | 0 refills | Status: AC
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
## 2023-01-26 ENCOUNTER — Ambulatory Visit: Payer: 59 | Admitting: Family

## 2023-02-17 ENCOUNTER — Other Ambulatory Visit: Payer: Self-pay | Admitting: Family Medicine

## 2023-04-18 IMAGING — MG MM DIGITAL SCREENING BILAT W/ TOMO AND CAD
8 series · 8 of 24 positions shown · non-contrast
Comparison: Previous exam(s).

CLINICAL DATA: Screening.

EXAM:
DIGITAL SCREENING BILATERAL MAMMOGRAM WITH TOMOSYNTHESIS AND CAD
TECHNIQUE: Bilateral screening digital craniocaudal and mediolateral oblique
mammograms were obtained. Bilateral screening digital breast
tomosynthesis was performed. The images were evaluated with
computer-aided detection.

[R MLO synth-2D]
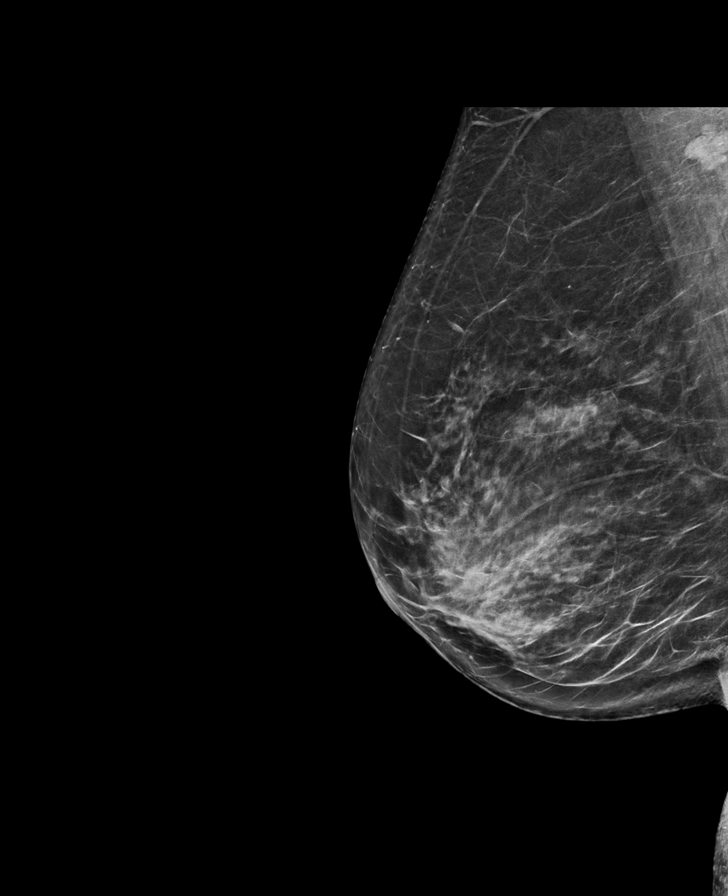

[L CC synth-2D]
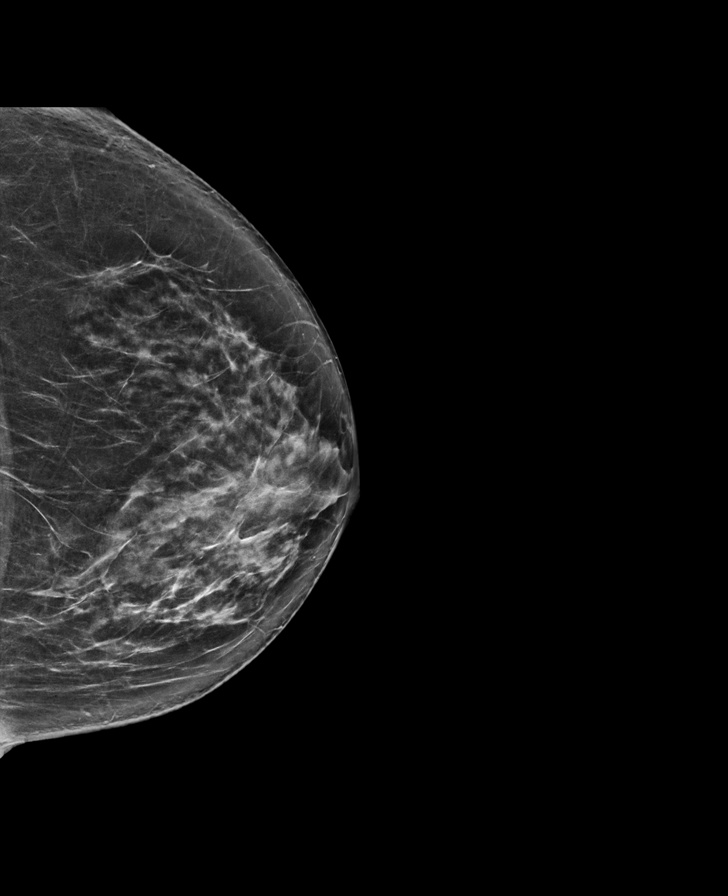

[R CC synth-2D]
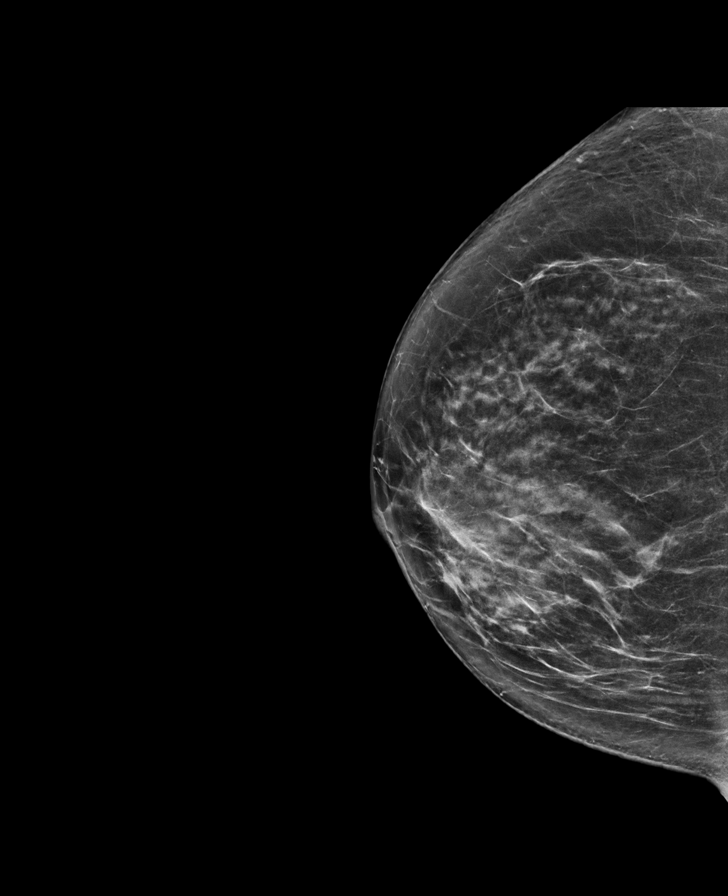

[L MLO synth-2D]
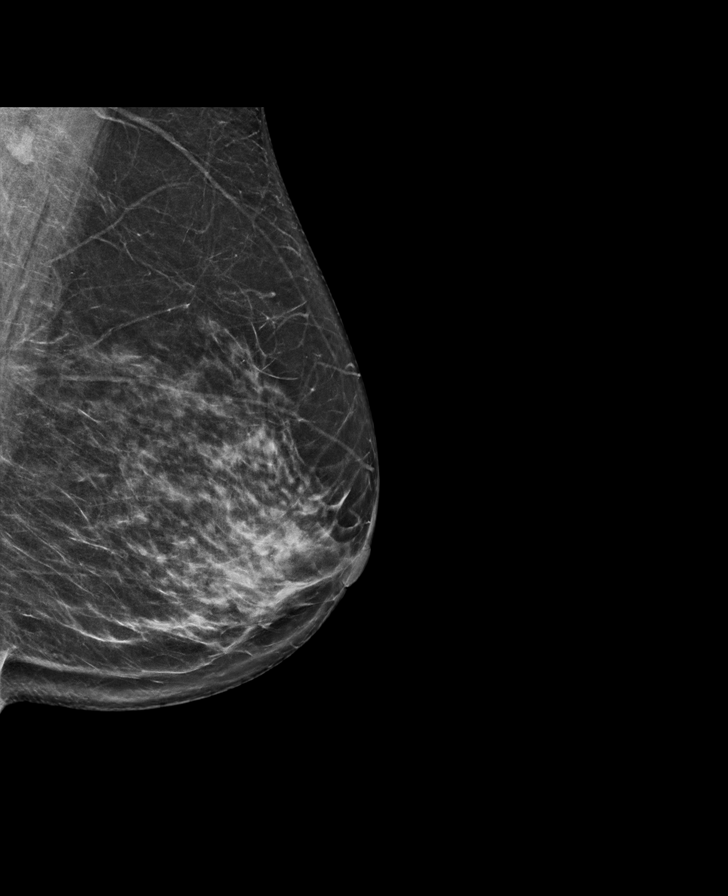

[L CC tomo · tomo slice 37/74.0]
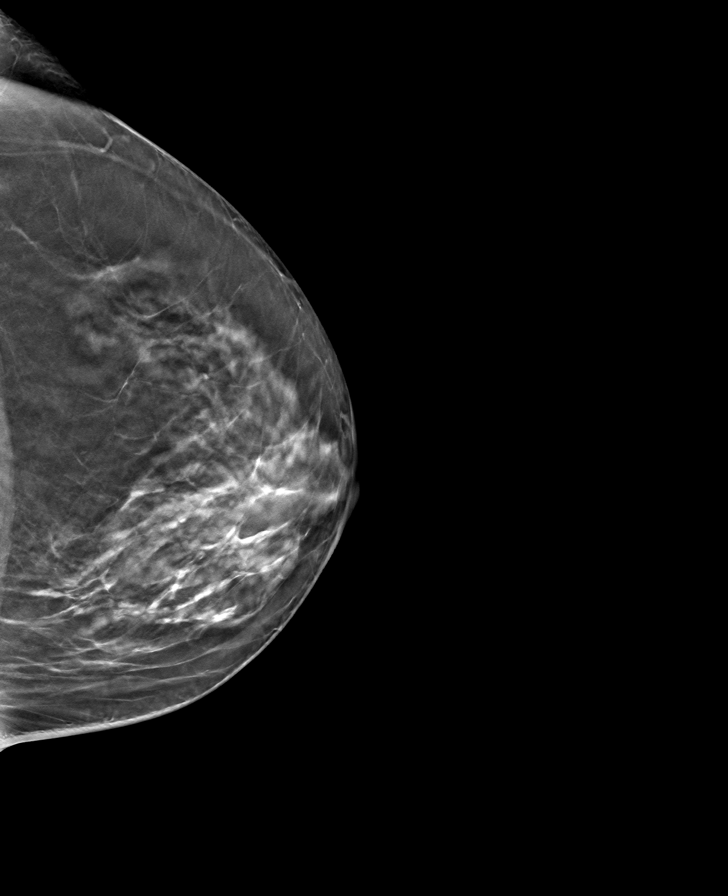

[R MLO tomo · tomo slice 37/74.0]
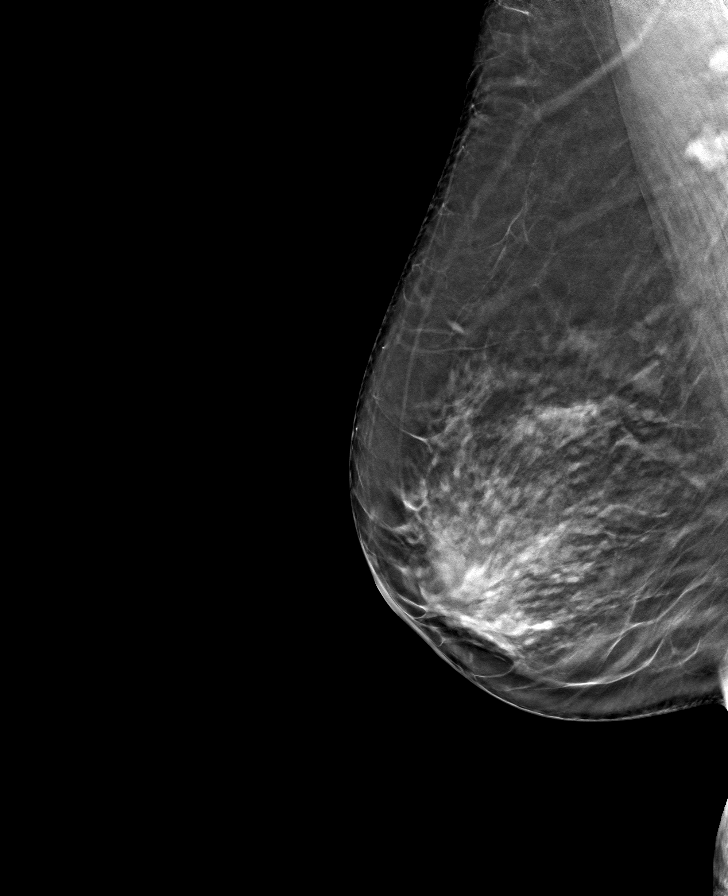

[L MLO tomo · tomo slice 36/71.0]
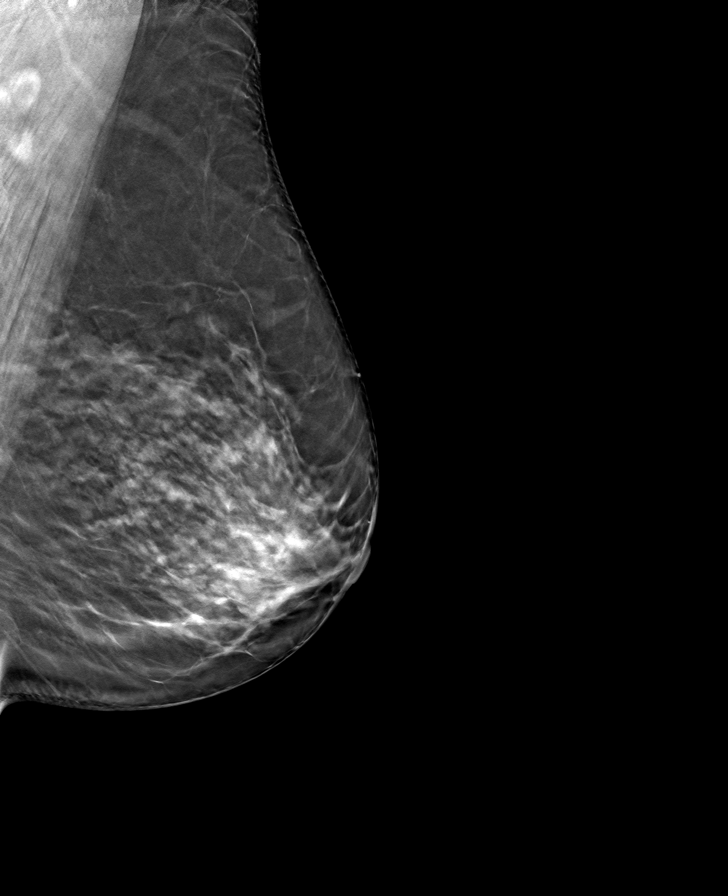

[R CC tomo · tomo slice 37/72.0]
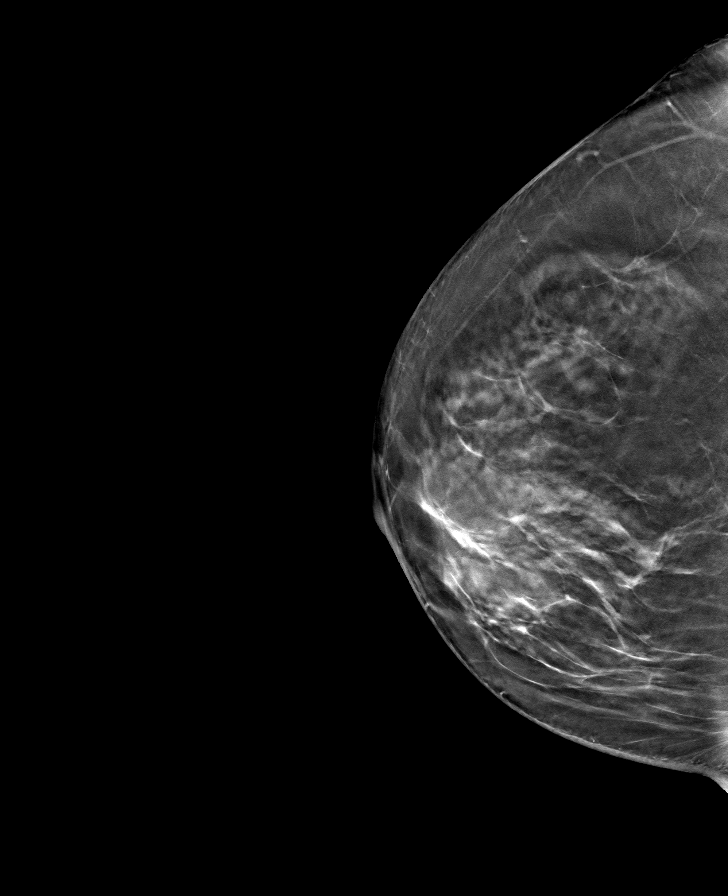

[8 of 24 positions shown; findings below may reference images not displayed]

ACR Breast Density Category c: The breast tissue is heterogeneously
dense, which may obscure small masses.
FINDINGS: There are no findings suspicious for malignancy.
IMPRESSION: No mammographic evidence of malignancy. A result letter of this
screening mammogram will be mailed directly to the patient.

RECOMMENDATION:
Screening mammogram in one year. (Code:Q3-W-BC3)

BI-RADS CATEGORY  1: Negative.

## 2023-05-15 ENCOUNTER — Encounter: Payer: Self-pay | Admitting: Family Medicine

## 2023-05-15 DIAGNOSIS — Z1231 Encounter for screening mammogram for malignant neoplasm of breast: Secondary | ICD-10-CM

## 2023-05-24 ENCOUNTER — Other Ambulatory Visit: Payer: Self-pay | Admitting: Family Medicine

## 2023-08-21 ENCOUNTER — Other Ambulatory Visit: Payer: Self-pay | Admitting: Family Medicine

## 2023-10-15 ENCOUNTER — Encounter: Payer: Self-pay | Admitting: Family Medicine

## 2023-10-15 ENCOUNTER — Ambulatory Visit (INDEPENDENT_AMBULATORY_CARE_PROVIDER_SITE_OTHER): Payer: 59 | Admitting: Family Medicine

## 2023-10-15 ENCOUNTER — Encounter: Payer: 59 | Admitting: Family Medicine

## 2023-10-15 ENCOUNTER — Other Ambulatory Visit (HOSPITAL_COMMUNITY)
Admission: RE | Admit: 2023-10-15 | Discharge: 2023-10-15 | Disposition: A | Discharge status: Home / Self Care | Source: Ambulatory Visit | Attending: Family Medicine | Admitting: Family Medicine

## 2023-10-15 VITALS — BP 123/61 | HR 85 | Ht 64.0 in | Wt 182.0 lb

## 2023-10-15 DIAGNOSIS — M109 Gout, unspecified: Secondary | ICD-10-CM

## 2023-10-15 DIAGNOSIS — E039 Hypothyroidism, unspecified: Secondary | ICD-10-CM

## 2023-10-15 DIAGNOSIS — Z0000 Encounter for general adult medical examination without abnormal findings: Secondary | ICD-10-CM | POA: Insufficient documentation

## 2023-10-15 DIAGNOSIS — R739 Hyperglycemia, unspecified: Secondary | ICD-10-CM

## 2023-10-15 DIAGNOSIS — I10 Essential (primary) hypertension: Secondary | ICD-10-CM | POA: Diagnosis not present

## 2023-10-15 DIAGNOSIS — E785 Hyperlipidemia, unspecified: Secondary | ICD-10-CM | POA: Diagnosis not present

## 2023-10-15 DIAGNOSIS — Z124 Encounter for screening for malignant neoplasm of cervix: Secondary | ICD-10-CM | POA: Diagnosis present

## 2023-10-15 LAB — CBC WITH DIFFERENTIAL/PLATELET
Basophils Absolute: 0 10*3/uL (ref 0.0–0.1)
Basophils Relative: 0.5 % (ref 0.0–3.0)
Eosinophils Absolute: 0.1 10*3/uL (ref 0.0–0.7)
Eosinophils Relative: 1.2 % (ref 0.0–5.0)
HCT: 42.5 % (ref 36.0–46.0)
Hemoglobin: 14.2 g/dL (ref 12.0–15.0)
Lymphocytes Relative: 20.2 % (ref 12.0–46.0)
Lymphs Abs: 1 10*3/uL (ref 0.7–4.0)
MCHC: 33.4 g/dL (ref 30.0–36.0)
MCV: 98.4 fl (ref 78.0–100.0)
Monocytes Absolute: 0.3 10*3/uL (ref 0.1–1.0)
Monocytes Relative: 6.3 % (ref 3.0–12.0)
Neutro Abs: 3.4 10*3/uL (ref 1.4–7.7)
Neutrophils Relative %: 71.8 % (ref 43.0–77.0)
Platelets: 193 10*3/uL (ref 150.0–400.0)
RBC: 4.32 Mil/uL (ref 3.87–5.11)
RDW: 13.5 % (ref 11.5–15.5)
WBC: 4.7 10*3/uL (ref 4.0–10.5)

## 2023-10-15 LAB — LIPID PANEL
Cholesterol: 206 mg/dL — ABNORMAL HIGH (ref 0–200)
HDL: 85.9 mg/dL (ref >39.00)
LDL Cholesterol: 95 mg/dL (ref 0–99)
NonHDL: 120.39
Total CHOL/HDL Ratio: 2
Triglycerides: 125 mg/dL (ref 0.0–149.0)
VLDL: 25 mg/dL (ref 0.0–40.0)

## 2023-10-15 LAB — COMPREHENSIVE METABOLIC PANEL WITH GFR
ALT: 26 U/L (ref 0–35)
AST: 27 U/L (ref 0–37)
Albumin: 4.5 g/dL (ref 3.5–5.2)
Alkaline Phosphatase: 55 U/L (ref 39–117)
BUN: 19 mg/dL (ref 6–23)
CO2: 26 meq/L (ref 19–32)
Calcium: 9.5 mg/dL (ref 8.4–10.5)
Chloride: 105 meq/L (ref 96–112)
Creatinine, Ser: 0.99 mg/dL (ref 0.40–1.20)
GFR: 61.7 mL/min (ref >60.00)
Glucose, Bld: 90 mg/dL (ref 70–99)
Potassium: 4.3 meq/L (ref 3.5–5.1)
Sodium: 142 meq/L (ref 135–145)
Total Bilirubin: 0.5 mg/dL (ref 0.2–1.2)
Total Protein: 7.5 g/dL (ref 6.0–8.3)

## 2023-10-15 LAB — TSH: TSH: 0.62 u[IU]/mL (ref 0.35–5.50)

## 2023-10-15 LAB — URIC ACID: Uric Acid, Serum: 9.3 mg/dL — ABNORMAL HIGH (ref 2.4–7.0)

## 2023-10-15 NOTE — Progress Notes (Signed)
 Complete physical exam  Patient: Cindy Perkins   DOB: 1961-05-22   63 y.o. Female  MRN: [id number]  Subjective:    Chief Complaint  Patient presents with   Annual Exam    Cindy Perkins is a 63 y.o. female who presents today for a complete physical exam. She reports consuming a general diet. Home exercise routine includes walking. She generally feels well. She reports sleeping well. She does not have additional problems to discuss today.   Currently lives with: husband Acute concerns or interim problems since last visit: no  Vision concerns: no Dental concerns: no STD concerns: no  ETOH use: 6 beers per week Nicotine use: no Recreational drugs/illegal substances: no        Most recent fall risk assessment:    10/15/2023    9:26 AM  Fall Risk   Falls in the past year? 0  Number falls in past yr: 0  Injury with Fall? 0  Risk for fall due to : No Fall Risks  Follow up Falls evaluation completed     Most recent depression screenings:    10/15/2023    9:27 AM 10/12/2022    8:18 AM  PHQ 2/9 Scores  PHQ - 2 Score 0 0  PHQ- 9 Score 1 0      10/15/2023    9:27 AM 10/12/2022    8:18 AM  GAD 7 : Generalized Anxiety Score  Nervous, Anxious, on Edge 0 0  Control/stop worrying 0 0  Worry too much - different things 0   Trouble relaxing 0 0  Restless 0 0  Easily annoyed or irritable 0 0  Afraid - awful might happen 0 0  Total GAD 7 Score 0   Anxiety Difficulty Not difficult at all Not difficult at all              Patient Care Team: Bradd Canary, MD as PCP - General (Family Medicine)   Outpatient Medications Prior to Visit  Medication Sig   Calcium Carbonate-Vitamin D (CALCIUM-VITAMIN D) 500-200 MG-UNIT per tablet Take 1 tablet by mouth 2 (two) times daily with a meal. Reported on 10/01/2015   ibuprofen (ADVIL,MOTRIN) 200 MG tablet Take 400 mg by mouth every 6 (six) hours as needed.   levothyroxine (SYNTHROID) 100 MCG tablet TAKE 1 TABLET BY MOUTH  EVERY DAY BEFORE BREAKFAST   LORazepam (ATIVAN) 0.5 MG tablet Take 1 tablet (0.5 mg total) by mouth 2 (two) times daily as needed for anxiety.   Multiple Vitamin (MULTIVITAMIN) capsule Take 1 capsule by mouth daily.     olmesartan-hydrochlorothiazide (BENICAR HCT) 20-12.5 MG tablet TAKE 1 TABLET BY MOUTH EVERY DAY   No facility-administered medications prior to visit.    ROS  All review of systems negative except what is listed in the HPI       Objective:     BP 123/61   Pulse 85   Ht 5\' 4"  (1.626 m)   Wt 182 lb (82.6 kg)   SpO2 98%   BMI 31.24 kg/m    Physical Exam Vitals reviewed. Exam conducted with a chaperone present.  Constitutional:      General: She is not in acute distress.    Appearance: Normal appearance. She is not ill-appearing.  HENT:     Head: Normocephalic and atraumatic.     Right Ear: Tympanic membrane normal.     Left Ear: Tympanic membrane normal.     Nose: Nose normal.     Mouth/Throat:  Mouth: Mucous membranes are moist.     Pharynx: Oropharynx is clear.  Eyes:     Extraocular Movements: Extraocular movements intact.     Conjunctiva/sclera: Conjunctivae normal.     Pupils: Pupils are equal, round, and reactive to light.  Neck:     Vascular: No carotid bruit.  Cardiovascular:     Rate and Rhythm: Normal rate and regular rhythm.     Pulses: Normal pulses.     Heart sounds: Normal heart sounds.  Pulmonary:     Effort: Pulmonary effort is normal.     Breath sounds: Normal breath sounds.  Abdominal:     General: Abdomen is flat. Bowel sounds are normal. There is no distension.     Palpations: Abdomen is soft. There is no mass.     Tenderness: There is no abdominal tenderness. There is no right CVA tenderness, left CVA tenderness, guarding or rebound.  Genitourinary:    Vagina: Normal.     Cervix: No cervical motion tenderness, discharge, friability, lesion, erythema, cervical bleeding or eversion.     Adnexa:        Right: No mass,  tenderness or fullness.         Left: No mass, tenderness or fullness.       Rectum: Normal.  Musculoskeletal:        General: Normal range of motion.     Cervical back: Normal range of motion and neck supple. No tenderness.     Right lower leg: No edema.     Left lower leg: No edema.  Lymphadenopathy:     Cervical: No cervical adenopathy.  Skin:    General: Skin is warm and dry.     Capillary Refill: Capillary refill takes less than 2 seconds.  Neurological:     General: No focal deficit present.     Mental Status: She is alert and oriented to person, place, and time. Mental status is at baseline.  Psychiatric:        Mood and Affect: Mood normal.        Behavior: Behavior normal.        Thought Content: Thought content normal.        Judgment: Judgment normal.      No results found for any visits on 10/15/23.     Assessment & Plan:    Routine Health Maintenance and Physical Exam Discussed health promotion and safety including diet and exercise recommendations, dental health, and injury prevention. Tobacco cessation if applicable. Seat belts, sunscreen, smoke detectors, etc.    Immunization History  Administered Date(s) Administered   Influenza Whole 04/03/2008, 05/18/2010   Influenza, Seasonal, Injecte, Preservative Fre 05/17/2014   Influenza-Unspecified 05/18/2019, 05/18/2020, 05/17/2021, 06/01/2022   PFIZER Comirnaty(Gray Top)Covid-19 Tri-Sucrose Vaccine 08/19/2020   PFIZER(Purple Top)SARS-COV-2 Vaccination 02/03/2020, 02/24/2020, 08/19/2020, 05/17/2021   Td 07/17/2005   Tdap 10/06/2016   Zoster Recombinant(Shingrix) 10/06/2021, 12/20/2021    Health Maintenance  Topic Date Due   HIV Screening  Never done   Hepatitis C Screening  Never done   Cervical Cancer Screening (HPV/Pap Cotest)  10/08/2020   INFLUENZA VACCINE  10/15/2023 (Originally 02/15/2023)   COVID-19 Vaccine (6 - 2024-25 season) 10/11/2024 (Originally 03/18/2023)   Colonoscopy  06/08/2024   MAMMOGRAM   11/15/2024   DTaP/Tdap/Td (3 - Td or Tdap) 10/07/2026   Zoster Vaccines- Shingrix  Completed   HPV VACCINES  Aged Out        Problem List Items Addressed This Visit  Active Problems   Hypothyroidism   Relevant Orders   TSH   Essential hypertension   Relevant Orders   Comprehensive metabolic panel with GFR   Hyperglycemia   Relevant Orders   Comprehensive metabolic panel with GFR   Preventative health care - Primary   Relevant Orders   Cytology - PAP   CBC with Differential/Platelet   Comprehensive metabolic panel with GFR   Lipid panel   TSH   Uric acid   Hyperlipidemia   Relevant Orders   Lipid panel   Gout of right foot   Relevant Orders   Uric acid   Other Visit Diagnoses       Papanicolaou smear       Relevant Orders   Cytology - PAP        PATIENT COUNSELING:    Sexuality: Discussed sexually transmitted diseases, partner selection, use of condoms, avoidance of unintended pregnancy, and contraceptive alternatives.    I discussed with the patient that most people either abstain from alcohol or drink within safe limits (<=14/week and <=4 drinks/occasion for males, <=7/weeks and <= 3 drinks/occasion for females) and that the risk for alcohol disorders and other health effects rises proportionally with the number of drinks per week and how often a drinker exceeds daily limits.  Discussed cessation/primary prevention of drug use and availability of treatment for abuse.   Diet: Encouraged to adjust caloric intake to maintain or achieve ideal body weight, to reduce intake of dietary saturated fat and total fat, to limit sodium intake by avoiding high sodium foods and not adding table salt, and to maintain adequate dietary potassium and calcium preferably from fresh fruits, vegetables, and low-fat dairy products. Encouraged vitamin D 1000 units and Calcium 1300mg  or 4 servings of dairy a day.  Emphasized the importance of regular exercise.  Injury  prevention: Discussed safety belts, safety helmets, smoke detector, smoking near bedding or upholstery.   Dental health: Discussed importance of regular tooth brushing, flossing, and dental visits.     Return in about 1 year (around 10/14/2024) for physical.     Clayborne Dana, NP

## 2023-10-17 LAB — CYTOLOGY - PAP
Comment: NEGATIVE
Diagnosis: NEGATIVE
High risk HPV: NEGATIVE

## 2023-11-14 ENCOUNTER — Other Ambulatory Visit: Payer: Self-pay | Admitting: Family Medicine

## 2023-11-16 ENCOUNTER — Other Ambulatory Visit: Payer: Self-pay | Admitting: Family Medicine

## 2023-11-16 DIAGNOSIS — Z0000 Encounter for general adult medical examination without abnormal findings: Secondary | ICD-10-CM

## 2023-11-19 ENCOUNTER — Ambulatory Visit
Admission: RE | Admit: 2023-11-19 | Discharge: 2023-11-19 | Disposition: A | Discharge status: Home / Self Care | Source: Ambulatory Visit | Attending: Family Medicine | Admitting: Family Medicine

## 2023-11-19 DIAGNOSIS — Z0000 Encounter for general adult medical examination without abnormal findings: Secondary | ICD-10-CM

## 2024-02-09 ENCOUNTER — Other Ambulatory Visit: Payer: Self-pay | Admitting: Family Medicine

## 2024-02-21 ENCOUNTER — Other Ambulatory Visit: Payer: Self-pay | Admitting: Family Medicine

## 2024-02-21 ENCOUNTER — Telehealth: Payer: Self-pay

## 2024-02-21 MED ORDER — LORAZEPAM 0.5 MG PO TABS: 0.5000 mg | ORAL_TABLET | Freq: Two times a day (BID) | ORAL | 1 refills | Status: AC | PRN

## 2024-02-21 NOTE — Telephone Encounter (Unsigned)
 Copied from CRM [id number]. Topic: Clinical - Medication Refill >> Feb 21, 2024 11:19 AM Burnard DEL wrote: Medication: LORazepam  (ATIVAN ) 0.5 MG tablet  Has the patient contacted their pharmacy? No (Agent: If no, request that the patient contact the pharmacy for the refill. If patient does not wish to contact the pharmacy document the reason why and proceed with request.) (Agent: If yes, when and what did the pharmacy advise?)  This is the patient's preferred pharmacy:  [id number] [address] [facility] - [address] [place] [address] Phone: [phone] Fax: [phone]    Is this the correct pharmacy for this prescription? Yes If no, delete pharmacy and type the correct one.   Has the prescription been filled recently? No  Is the patient out of the medication? Yes  Has the patient been seen for an appointment in the last year OR does the patient have an upcoming appointment? Yes  Can we respond through MyChart? Yes  Agent: Please be advised that Rx refills may take up to 3 business days. We ask that you follow-up with your pharmacy.

## 2024-02-21 NOTE — Telephone Encounter (Signed)
 Requesting: Ativan  0.5 MG Contract: none UDS: None Last Visit: 10/15/23 Next Visit: 10/15/24 Last Refill: [id number]  Please Advise

## 2024-02-21 NOTE — Telephone Encounter (Signed)
 Copied from CRM [id number]. Topic: Clinical - Medical Advice >> Feb 21, 2024 11:20 AM Burnard DEL wrote: Reason for CRM: Patient would like to know if Dr Domenica could suggest anything for her ears for her upcoming flight at the end of August.She would like to know of any ear drops or would ear plugs that would possibly help? Please advise .

## 2024-02-25 NOTE — Telephone Encounter (Signed)
 Patient was advised and verbalized understanding.

## 2024-05-08 ENCOUNTER — Other Ambulatory Visit: Payer: Self-pay | Admitting: Family Medicine

## 2024-08-08 ENCOUNTER — Other Ambulatory Visit: Payer: Self-pay | Admitting: Family Medicine

## 2024-10-15 ENCOUNTER — Encounter: Admitting: Family Medicine

## 2024-10-15 ENCOUNTER — Encounter: Admitting: Student
